# Patient Record
Sex: Male | Born: 1955 | Race: White | Hispanic: No | Marital: Married | Smoking: Former smoker
Health system: Southern US, Community
[De-identification: ages and names within clinical notes are randomized; demographics above are authoritative.]

## PROBLEM LIST (undated history)

## (undated) DIAGNOSIS — L739 Follicular disorder, unspecified: Secondary | ICD-10-CM

## (undated) DIAGNOSIS — E7800 Pure hypercholesterolemia, unspecified: Secondary | ICD-10-CM

## (undated) DIAGNOSIS — I2510 Atherosclerotic heart disease of native coronary artery without angina pectoris: Secondary | ICD-10-CM

## (undated) DIAGNOSIS — I10 Essential (primary) hypertension: Secondary | ICD-10-CM

## (undated) HISTORY — PX: ROTATOR CUFF REPAIR: SHX139

## (undated) HISTORY — DX: Atherosclerotic heart disease of native coronary artery without angina pectoris: I25.10

## (undated) HISTORY — PX: TONSILLECTOMY AND ADENOIDECTOMY: SUR1326

---

## 2000-11-23 ENCOUNTER — Ambulatory Visit (HOSPITAL_COMMUNITY): Admission: RE | Admit: 2000-11-23 | Discharge: 2000-11-23 | Payer: Self-pay | Admitting: Gastroenterology

## 2003-11-21 ENCOUNTER — Ambulatory Visit (HOSPITAL_COMMUNITY): Admission: RE | Admit: 2003-11-21 | Discharge: 2003-11-21 | Payer: Self-pay | Admitting: Gastroenterology

## 2012-03-01 ENCOUNTER — Other Ambulatory Visit: Payer: Self-pay

## 2012-03-01 ENCOUNTER — Encounter (HOSPITAL_BASED_OUTPATIENT_CLINIC_OR_DEPARTMENT_OTHER): Payer: Self-pay | Admitting: Family Medicine

## 2012-03-01 ENCOUNTER — Inpatient Hospital Stay (HOSPITAL_BASED_OUTPATIENT_CLINIC_OR_DEPARTMENT_OTHER)
Admission: EM | Admit: 2012-03-01 | Discharge: 2012-03-06 | DRG: 246 | Disposition: A | Discharge status: Home / Self Care | Payer: PRIVATE HEALTH INSURANCE | Attending: Internal Medicine | Admitting: Internal Medicine

## 2012-03-01 ENCOUNTER — Emergency Department (HOSPITAL_BASED_OUTPATIENT_CLINIC_OR_DEPARTMENT_OTHER): Payer: PRIVATE HEALTH INSURANCE

## 2012-03-01 DIAGNOSIS — K5289 Other specified noninfective gastroenteritis and colitis: Secondary | ICD-10-CM | POA: Diagnosis present

## 2012-03-01 DIAGNOSIS — Z87891 Personal history of nicotine dependence: Secondary | ICD-10-CM

## 2012-03-01 DIAGNOSIS — Z79899 Other long term (current) drug therapy: Secondary | ICD-10-CM

## 2012-03-01 DIAGNOSIS — I2510 Atherosclerotic heart disease of native coronary artery without angina pectoris: Secondary | ICD-10-CM | POA: Diagnosis present

## 2012-03-01 DIAGNOSIS — R112 Nausea with vomiting, unspecified: Secondary | ICD-10-CM | POA: Diagnosis present

## 2012-03-01 DIAGNOSIS — Z7982 Long term (current) use of aspirin: Secondary | ICD-10-CM

## 2012-03-01 DIAGNOSIS — E872 Acidosis, unspecified: Secondary | ICD-10-CM | POA: Diagnosis present

## 2012-03-01 DIAGNOSIS — E875 Hyperkalemia: Secondary | ICD-10-CM | POA: Diagnosis present

## 2012-03-01 DIAGNOSIS — E1110 Type 2 diabetes mellitus with ketoacidosis without coma: Secondary | ICD-10-CM | POA: Diagnosis present

## 2012-03-01 DIAGNOSIS — D72829 Elevated white blood cell count, unspecified: Secondary | ICD-10-CM | POA: Diagnosis present

## 2012-03-01 DIAGNOSIS — I959 Hypotension, unspecified: Secondary | ICD-10-CM | POA: Diagnosis present

## 2012-03-01 DIAGNOSIS — I214 Non-ST elevation (NSTEMI) myocardial infarction: Secondary | ICD-10-CM

## 2012-03-01 DIAGNOSIS — R197 Diarrhea, unspecified: Secondary | ICD-10-CM | POA: Diagnosis present

## 2012-03-01 DIAGNOSIS — K859 Acute pancreatitis without necrosis or infection, unspecified: Secondary | ICD-10-CM

## 2012-03-01 DIAGNOSIS — E7800 Pure hypercholesterolemia, unspecified: Secondary | ICD-10-CM | POA: Diagnosis present

## 2012-03-01 DIAGNOSIS — I309 Acute pericarditis, unspecified: Secondary | ICD-10-CM

## 2012-03-01 DIAGNOSIS — E1010 Type 1 diabetes mellitus with ketoacidosis without coma: Secondary | ICD-10-CM | POA: Diagnosis present

## 2012-03-01 DIAGNOSIS — E785 Hyperlipidemia, unspecified: Secondary | ICD-10-CM | POA: Diagnosis present

## 2012-03-01 DIAGNOSIS — L089 Local infection of the skin and subcutaneous tissue, unspecified: Secondary | ICD-10-CM | POA: Diagnosis present

## 2012-03-01 DIAGNOSIS — D649 Anemia, unspecified: Secondary | ICD-10-CM | POA: Diagnosis present

## 2012-03-01 DIAGNOSIS — Z794 Long term (current) use of insulin: Secondary | ICD-10-CM

## 2012-03-01 DIAGNOSIS — I252 Old myocardial infarction: Secondary | ICD-10-CM | POA: Diagnosis present

## 2012-03-01 DIAGNOSIS — Z955 Presence of coronary angioplasty implant and graft: Secondary | ICD-10-CM

## 2012-03-01 DIAGNOSIS — E871 Hypo-osmolality and hyponatremia: Secondary | ICD-10-CM | POA: Diagnosis present

## 2012-03-01 HISTORY — DX: Pure hypercholesterolemia, unspecified: E78.00

## 2012-03-01 LAB — POCT I-STAT 3, ART BLOOD GAS (G3+)
Acid-base deficit: 25 mmol/L — ABNORMAL HIGH (ref 0.0–2.0)
Bicarbonate: 3.5 mEq/L — ABNORMAL LOW (ref 20.0–24.0)
pO2, Arterial: 128 mmHg — ABNORMAL HIGH (ref 80.0–100.0)

## 2012-03-01 LAB — CBC WITH DIFFERENTIAL/PLATELET
Basophils Relative: 0 % (ref 0–1)
Eosinophils Absolute: 0 10*3/uL (ref 0.0–0.7)
Eosinophils Relative: 0 % (ref 0–5)
Hemoglobin: 17.7 g/dL — ABNORMAL HIGH (ref 13.0–17.0)
Lymphocytes Relative: 7 % — ABNORMAL LOW (ref 12–46)
Monocytes Relative: 9 % (ref 3–12)
Neutrophils Relative %: 84 % — ABNORMAL HIGH (ref 43–77)
RBC: 5.51 MIL/uL (ref 4.22–5.81)
WBC: 20.6 10*3/uL — ABNORMAL HIGH (ref 4.0–10.5)

## 2012-03-01 LAB — BASIC METABOLIC PANEL
BUN: 17 mg/dL (ref 6–23)
CO2: 11 mEq/L — ABNORMAL LOW (ref 19–32)
CO2: 13 mEq/L — ABNORMAL LOW (ref 19–32)
Calcium: 7.6 mg/dL — ABNORMAL LOW (ref 8.4–10.5)
Chloride: 107 mEq/L (ref 96–112)
Creatinine, Ser: 0.8 mg/dL (ref 0.50–1.35)
Glucose, Bld: 195 mg/dL — ABNORMAL HIGH (ref 70–99)
Sodium: 136 mEq/L (ref 135–145)

## 2012-03-01 LAB — COMPREHENSIVE METABOLIC PANEL
ALT: 18 U/L (ref 0–53)
Alkaline Phosphatase: 112 U/L (ref 39–117)
CO2: 5 mEq/L — CL (ref 19–32)
Chloride: 91 mEq/L — ABNORMAL LOW (ref 96–112)
GFR calc Af Amer: 75 mL/min — ABNORMAL LOW (ref >90)
Glucose, Bld: 395 mg/dL — ABNORMAL HIGH (ref 70–99)
Potassium: 5.6 mEq/L — ABNORMAL HIGH (ref 3.5–5.1)
Sodium: 132 mEq/L — ABNORMAL LOW (ref 135–145)
Total Protein: 8.7 g/dL — ABNORMAL HIGH (ref 6.0–8.3)

## 2012-03-01 LAB — CBC
HCT: 39.3 % (ref 39.0–52.0)
Hemoglobin: 13.8 g/dL (ref 13.0–17.0)
MCH: 31.9 pg (ref 26.0–34.0)
MCHC: 35.1 g/dL (ref 30.0–36.0)
MCV: 91 fL (ref 78.0–100.0)

## 2012-03-01 LAB — GLUCOSE, CAPILLARY
Glucose-Capillary: 389 mg/dL — ABNORMAL HIGH (ref 70–99)
Glucose-Capillary: 370 mg/dL — ABNORMAL HIGH (ref 70–99)
Glucose-Capillary: 235 mg/dL — ABNORMAL HIGH (ref 70–99)
Glucose-Capillary: 233 mg/dL — ABNORMAL HIGH (ref 70–99)

## 2012-03-01 LAB — MRSA PCR SCREENING: MRSA by PCR: NEGATIVE

## 2012-03-01 MED ORDER — SODIUM CHLORIDE 0.9 % IV SOLN
INTRAVENOUS | Status: DC
  Administered 2012-03-01: 3.1 [IU]/h via INTRAVENOUS

## 2012-03-01 MED ORDER — ONDANSETRON HCL 4 MG/2ML IJ SOLN
4.0000 mg | Freq: Once | INTRAMUSCULAR | Status: AC
  Administered 2012-03-01: 4 mg via INTRAVENOUS
  Filled 2012-03-01: qty 2

## 2012-03-01 MED ORDER — SODIUM CHLORIDE 0.9 % IV SOLN: INTRAVENOUS | Status: DC

## 2012-03-01 MED ORDER — SODIUM CHLORIDE 0.9 % IV SOLN
INTRAVENOUS | Status: AC
  Administered 2012-03-01: 19:00:00 via INTRAVENOUS

## 2012-03-01 MED ORDER — SODIUM CHLORIDE 0.9 % IV SOLN
INTRAVENOUS | Status: DC
  Administered 2012-03-02: 1.6 [IU]/h via INTRAVENOUS
  Filled 2012-03-01: qty 1

## 2012-03-01 MED ORDER — ASPIRIN EC 325 MG PO TBEC
325.0000 mg | DELAYED_RELEASE_TABLET | Freq: Once | ORAL | Status: AC
  Administered 2012-03-01: 325 mg via ORAL
  Filled 2012-03-01: qty 1

## 2012-03-01 MED ORDER — HEPARIN SODIUM (PORCINE) 5000 UNIT/ML IJ SOLN
5000.0000 [IU] | Freq: Three times a day (TID) | INTRAMUSCULAR | Status: DC
  Administered 2012-03-01: 5000 [IU] via SUBCUTANEOUS
  Filled 2012-03-01 (×2): qty 1

## 2012-03-01 MED ORDER — INSULIN REGULAR HUMAN 100 UNIT/ML IJ SOLN
INTRAMUSCULAR | Status: AC
  Filled 2012-03-01: qty 1

## 2012-03-01 MED ORDER — SODIUM CHLORIDE 0.9 % IV SOLN
INTRAVENOUS | Status: DC
  Administered 2012-03-01: 19:00:00 via INTRAVENOUS
  Administered 2012-03-02: 150 mL/h via INTRAVENOUS
  Administered 2012-03-02 – 2012-03-03 (×4): via INTRAVENOUS

## 2012-03-01 MED ORDER — MORPHINE SULFATE 2 MG/ML IJ SOLN
1.0000 mg | INTRAMUSCULAR | Status: DC | PRN
  Administered 2012-03-01: 1 mg via INTRAVENOUS
  Administered 2012-03-02: 2 mg via INTRAVENOUS
  Administered 2012-03-02: 1 mg via INTRAVENOUS
  Administered 2012-03-03 (×3): 2 mg via INTRAVENOUS
  Filled 2012-03-01 (×6): qty 1

## 2012-03-01 MED ORDER — ASPIRIN 81 MG PO CHEW
CHEWABLE_TABLET | ORAL | Status: AC
  Filled 2012-03-01: qty 4

## 2012-03-01 MED ORDER — DEXTROSE 50 % IV SOLN: 25.0000 mL | INTRAVENOUS | Status: DC | PRN

## 2012-03-01 MED ORDER — MORPHINE SULFATE 2 MG/ML IJ SOLN
INTRAMUSCULAR | Status: AC
  Administered 2012-03-01: 1 mg via INTRAVENOUS
  Filled 2012-03-01: qty 1

## 2012-03-01 MED ORDER — MORPHINE SULFATE 4 MG/ML IJ SOLN
4.0000 mg | Freq: Once | INTRAMUSCULAR | Status: AC
  Administered 2012-03-01: 4 mg via INTRAVENOUS
  Filled 2012-03-01: qty 1

## 2012-03-01 MED ORDER — DEXTROSE-NACL 5-0.45 % IV SOLN
INTRAVENOUS | Status: DC
  Administered 2012-03-01 – 2012-03-02 (×3): via INTRAVENOUS

## 2012-03-01 MED ORDER — SODIUM CHLORIDE 0.9 % IV BOLUS (SEPSIS)
1000.0000 mL | Freq: Once | INTRAVENOUS | Status: AC
  Administered 2012-03-01: 1000 mL via INTRAVENOUS

## 2012-03-01 MED ORDER — ONDANSETRON HCL 4 MG/2ML IJ SOLN: 4.0000 mg | Freq: Three times a day (TID) | INTRAMUSCULAR | Status: DC | PRN

## 2012-03-01 MED ORDER — MORPHINE SULFATE 4 MG/ML IJ SOLN
INTRAMUSCULAR | Status: AC
  Administered 2012-03-01: 4 mg
  Filled 2012-03-01: qty 1

## 2012-03-01 NOTE — H&P (Signed)
Name: Jordan Waller MRN: [id number] DOB: 12-Apr-1956    LOS: 0  Referring Provider:  ED / [facility] Reason for Referral:  DKA   PULMONARY / CRITICAL CARE MEDICINE  HPI:  56 y/o M with hx of DM on insulin pump, HLD presented [facility]/12 with a 3-4 day hx of vomiting, diarrhea, generalized pain, HA and elevated blood sugars.  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  Also patient reports significant drinking at conference.  Work up demonstrated ph 7.0 / pCO2 13 / HCO3 3.5, K 5.6, creatinine 1.20, lipase 810, lactate 1.8 and moderate acetone in blood.  Rx's with NS, IV insulin and Tx to [facility] for further care.    Past Medical History  Diagnosis Date  . Diabetes mellitus   . High cholesterol    Past Surgical History  Procedure Date  . Rotator cuff repair    Prior to Admission medications   Medication Sig Start Date End Date Taking? Authorizing Provider  ibuprofen (ADVIL,MOTRIN) 200 MG tablet Take 600 mg by mouth every 8 (eight) hours as needed. For poby pain   Yes Historical Provider, MD  Insulin Human (INSULIN PUMP) 100 unit/ml SOLN Inject into the skin. Patient uses Novolog in pump on sliding scale up to 60 units daily as directed   Yes Historical Provider, MD  rosuvastatin (CRESTOR) 10 MG tablet Take 10 mg by mouth daily.   Yes Historical Provider, MD   Allergies No Known Allergies  Family History History reviewed. No pertinent family history. Social History  reports that he has never smoked. He does not have any smokeless tobacco history on file. He reports that he drinks alcohol. He reports that he does not use illicit drugs.  Review Of Systems:   Gen: Denies fever, chills, weight change, fatigue, night sweats HEENT: Denies blurred vision, double vision, hearing loss, tinnitus, sinus congestion, rhinorrhea, sore throat, neck stiffness, dysphagia PULM: Denies shortness of breath, cough,  sputum production, hemoptysis, wheezing CV: Denies chest pain, edema, orthopnea, paroxysmal nocturnal dyspnea, palpitations GI: Denies abdominal pain, hematochezia, melena, constipation, change in bowel habits.   Indicates n/v/d. GU: Denies dysuria, hematuria, polyuria, oliguria, urethral discharge Endocrine: Denies hot or cold intolerance, polyuria, polyphagia.  Decreased appetite.  Derm: Denies rash, dry skin, scaling or peeling skin change Heme: Denies easy bruising, bleeding, bleeding gums Neuro: Denies headache, numbness, weakness, slurred speech, loss of memory or consciousness  Brief patient description:  56 y/o M with DKA in setting of nausea / vomiting / diarrhea.  Events Since Admission: 8/12 - Admit with DKA  Current Status:  Vital Signs: Temp:  [97.5 F (36.4 C)-98.3 F (36.8 C)] 98.3 F (36.8 C) (08/12 1218) Pulse Rate:  [86-107] 88  (08/12 1800) Resp:  [12-24] 12  (08/12 1800) BP: (101-149)/(63-84) 101/63 mmHg (08/12 1800) SpO2:  [97 %-100 %] 98 % (08/12 1800) Weight:  [68.7 kg (151 lb 7.3 oz)-71.668 kg (158 lb)] 68.7 kg (151 lb 7.3 oz) (08/12 1733)  Physical Examination: General:  wdwn adult male in NAD Neuro:  AAOx4, speech clear, MAE HEENT:  Mm pink/moist, no jvd Cardiovascular:  s1s2 rrr, no m/r/g Lungs:  resp's even/non-labored, lungs bilaterally clear Abdomen:  Round / soft, bsx4 active Musculoskeletal:  No acute deformities Skin:  Red face, no breakdown  Active Problems:  DKA (diabetic ketoacidoses)  Metabolic acidosis  Nausea & vomiting  Diarrhea  ASSESSMENT AND PLAN  PULMONARY  Lab  03/01/12 1234  PHART 7.021*  PCO2ART 13.4*  PO2ART 128.0*  HCO3 3.5*  O2SAT 97.0   CXR:  8/12>>>nad  A:   No acute issues  P:   Goal SpO2>92 Supplemental oxygen  CARDIOVASCULAR  Lab 03/01/12 1045  TROPONINI --  LATICACIDVEN 1.8  PROBNP --   ECG:  Pending>>> Lines:   A:  Rule out MI in setting of DM (presented with nausea / vomiting, may be  atypical presentation of ACS given DM) .  Hemodynamically stable.  P:  check EKG cycle cardiac enzymes  RENAL  Lab 03/01/12 1010  NA 132*  K 5.6*  CL 91*  CO2 5*  BUN 23  CREATININE 1.20  CALCIUM 9.7  MG --  PHOS --   Intake/Output      08/12 0701 - 08/13 0700   I.V. (mL/kg) 5036.2 (73.3)   Total Intake(mL/kg) 5036.2 (73.3)   Urine (mL/kg/hr) 800 (0.8)   Total Output 800   Net +4236.2        Foley:  NA  A: Hyponatremia.  Hyperkalemia.  Metabolic Acidosis  P:   correct underlying causes fluids per DKA protocol Trend BMP  GASTROINTESTINAL  Lab 03/01/12 1010  AST 14  ALT 18  ALKPHOS 112  BILITOT 0.5  PROT 8.7*  ALBUMIN 5.5*   Lipase 810  A: Nausea / Vomiting / Diarrhea.  History of recent ETOH intake / elevated lipase - rule out pancreatitis - however clinically benign abdomen.  P:   PRN zofran follow lipase  HEMATOLOGIC  Lab 03/01/12 2015 03/01/12 1010  HGB 13.8 17.7*  HCT 39.3 49.8  PLT 189 309  INR -- --  APTT -- --   A:   No acute issues  P:  Trend CBC  INFECTIOUS  Lab 03/01/12 2015 03/01/12 1010  WBC 17.3* 20.6*  PROCALCITON -- --   Cultures: 8/12 UA>>> 8/12 BCx2>>> 8/12 PCT>>>  Antibiotics: Defered  A:  Leukocytosis, likely reactive.  No clear source of infection.  P:   check pct follow cultures hold abx for now  ENDOCRINE  Lab 03/01/12 1911 03/01/12 1807 03/01/12 1725 03/01/12 1628 03/01/12 1524  GLUCAP 168* 176* 233* 235* 274*   A:  DKA.  DM on insulin pump. P:   Per DKA protocol Diabetic educator consult in AM  NEUROLOGIC  A:  No acute Issues P:   monitor  BEST PRACTICE / DISPOSITION Level of Care:  ICU Primary Service:  PCCM Consultants:  None Code Status:  Full Code Diet:  NPO DVT Px:  Heparin GI Px:  None indicated Skin Integrity:  Intact Social / Family:  Wife updated at bedside  Canary Brim, NP-C [facility] Pgr: [phone] or [phone]  03/01/2012, 8:49  PM  Patient examined.  Records reviewed.  Case discussed with NP.  Assessment and plan as above.  The patient is critically ill with multiple organ systems failure and requires high complexity decision making for assessment and support, frequent evaluation and titration of therapies, application of advanced monitoring technologies and extensive interpretation of multiple databases. Critical Care Time devoted to patient care services described in this note is 35 minutes.  Orlean Bradford, M.D., F.C.C.P. Pulmonary and Critical Care Medicine [facility] Cell: [phone] Pager: [phone]

## 2012-03-01 NOTE — Progress Notes (Signed)
eLink Physician-Brief Progress Note Patient Name: GENE GLAZEBROOK DOB: 01-Oct-1955 MRN: [id number]  Date of Service  03/01/2012   HPI/Events of Note     eICU Interventions  ASA for pos trops   Intervention Category Intermediate Interventions: Diagnostic test evaluation  ALVA,RAKESH V. 03/01/2012, 9:30 PM

## 2012-03-01 NOTE — ED Notes (Signed)
Insulin drip increased from 3.1 units per hour to 6.4 units per hour per glucose stabilizer

## 2012-03-01 NOTE — ED Notes (Signed)
Pts wife removed his insulin pump per dr hunt request prior to the insulin drip started site reddened wife concerned area of redness marked states she doesn't know when he last changed the site as he was at a convention all weekend pt groggy unable to accurately tell us when he last changed the site. RT at bedside for abg unable to obtain dr hunt notified

## 2012-03-01 NOTE — ED Notes (Signed)
Report called to theresa  In icu [place]

## 2012-03-01 NOTE — ED Notes (Signed)
Pt c/o vomiting and generalized pain with headache since 3am yesterday. Pt has h/o DM with insulin pump and elevated blood sugar. Pt took advil at 0730am and 1 po phenergan. Pt reports vomiting approx 1 hr after taking med.

## 2012-03-01 NOTE — ED Provider Notes (Signed)
History     CSN: [id number]  Arrival date & time 03/01/12  1010   First MD Initiated Contact with Patient 03/01/12 1027      Chief Complaint  Patient presents with  . Emesis    (Consider location/radiation/quality/duration/timing/severity/associated sxs/prior treatment) Patient is a 56 y.o. male presenting with vomiting.  Emesis  Associated symptoms include headaches. Pertinent negatives include no abdominal pain.   Patient is a 56 year old male who presents today to planning of 3-4 days of increasing nausea, vomiting, and malaise. Patient has history of type 1 diabetes diagnosed at age 21 and normally maintained on an insulin pump. Patient has had frequent hyperglycemia with values as high as 600 over the past several days. He was attending a conference with up to 20,000 people from all over the world and per his wife continued to push himself though he was not feeling well. He has been around some international visitors who have had some similar symptoms. Patient had 2-3 episodes of diarrhea but is no longer having this. He denies any urinary symptoms or cough. Patient has not had any fevers. He does feel like his glucose has been responding initially 2 boluses of insulin and his wife feels like there is no problem with pump dysfunction at this site. Patient denies chest pain, shortness of breath, or other concerning symptoms. Patient does complain of a severe aching bifrontal headache but has no meningismus and no history of headaches. Patient  has no numbness, tingling, paresthesias, visual changes, or weakness.  Patient does have some associated photophobia. His room smells of ketones. There are no other associated or modifying factors. Past Medical History  Diagnosis Date  . Diabetes mellitus   . High cholesterol     Past Surgical History  Procedure Date  . Rotator cuff repair     History reviewed. No pertinent family history.  History  Substance Use Topics  . Smoking status:  Never Smoker   . Smokeless tobacco: Not on file  . Alcohol Use: Yes      Review of Systems  Constitutional: Positive for activity change, appetite change and fatigue.  Eyes: Positive for photophobia.  Respiratory: Negative.   Cardiovascular: Negative.   Gastrointestinal: Positive for nausea and vomiting. Negative for abdominal pain.  Genitourinary: Negative.   Musculoskeletal: Negative.   Skin: Negative.   Neurological: Positive for headaches.  Hematological: Negative.   Psychiatric/Behavioral: Negative.   All other systems reviewed and are negative.    Allergies  Review of patient's allergies indicates no known allergies.  Home Medications   Current Outpatient Rx  Name Route Sig Dispense Refill  . IBUPROFEN 200 MG PO TABS Oral Take 600 mg by mouth every 8 (eight) hours as needed. For poby pain    . INSULIN PUMP Subcutaneous Inject into the skin. Patient uses Novolog in pump on sliding scale up to 60 units daily as directed    . ROSUVASTATIN CALCIUM 10 MG PO TABS Oral Take 10 mg by mouth daily.      BP 138/76  Pulse 96  Temp 98.3 F (36.8 C) (Oral)  Resp 24  Ht 5\' 7"  (1.702 m)  Wt 158 lb (71.668 kg)  BMI 24.75 kg/m2  SpO2 99%  Physical Exam  Nursing note and vitals reviewed. GEN: Well-developed, well-nourished male in no distress HEENT: Atraumatic, normocephalic. Oropharynx clear without erythema EYES: PERRLA BL, no scleral icterus. NECK: Trachea midline, no meningismus CV: regular rate and rhythm. No murmurs, rubs, or gallops PULM: No respiratory distress.  No crackles, wheezes, or rales. GI: soft, non-tender. No guarding, rebound, or tenderness. + bowel sounds  GU: deferred Neuro: cranial nerves grossly 2-12 intact, no abnormalities of strength or sensation, A and O x 3 MSK: Patient moves all 4 extremities symmetrically, no deformity, edema, or injury noted Skin: No rashes petechiae, purpura, or jaundice Psych: no abnormality of mood   ED Course    Procedures (including critical care time)  CRITICAL CARE Performed by: Cyndra Numbers   Total critical care time: 45 min  Critical care time was exclusive of separately billable procedures and treating other patients.  Critical care was necessary to treat or prevent imminent or life-threatening deterioration.  Critical care was time spent personally by me on the following activities: development of treatment plan with patient and/or surrogate as well as nursing, discussions with consultants, evaluation of patient's response to treatment, examination of patient, obtaining history from patient or surrogate, ordering and performing treatments and interventions, ordering and review of laboratory studies, ordering and review of radiographic studies, pulse oximetry and re-evaluation of patient's condition.  Labs Reviewed  GLUCOSE, CAPILLARY - Abnormal; Notable for the following:    Glucose-Capillary 389 (*)     All other components within normal limits  CBC WITH DIFFERENTIAL - Abnormal; Notable for the following:    WBC 20.6 (*)     Hemoglobin 17.7 (*)     Neutrophils Relative 84 (*)     Lymphocytes Relative 7 (*)     Neutro Abs 17.3 (*)     Monocytes Absolute 1.9 (*)     All other components within normal limits  COMPREHENSIVE METABOLIC PANEL - Abnormal; Notable for the following:    Sodium 132 (*)     Potassium 5.6 (*)     Chloride 91 (*)     CO2 5 (*)     Glucose, Bld 395 (*)     Total Protein 8.7 (*)     Albumin 5.5 (*)     GFR calc non Af Amer 65 (*)     GFR calc Af Amer 75 (*)     All other components within normal limits  LIPASE, BLOOD - Abnormal; Notable for the following:    Lipase 810 (*)     All other components within normal limits  KETONES, QUALITATIVE - Abnormal; Notable for the following:    Acetone, Bld MODERATE (*)     All other components within normal limits  GLUCOSE, CAPILLARY - Abnormal; Notable for the following:    Glucose-Capillary 370 (*)     All other  components within normal limits  POCT I-STAT 3, BLOOD GAS (G3+) - Abnormal; Notable for the following:    pH, Arterial 7.021 (*)     pCO2 arterial 13.4 (*)     pO2, Arterial 128.0 (*)     Bicarbonate 3.5 (*)     Acid-base deficit 25.0 (*)     All other components within normal limits  GLUCOSE, CAPILLARY - Abnormal; Notable for the following:    Glucose-Capillary 382 (*)     All other components within normal limits  LACTIC ACID, PLASMA  URINALYSIS, ROUTINE W REFLEX MICROSCOPIC   Dg Chest 2 View  03/01/2012  *RADIOLOGY REPORT*  Clinical Data: Vomiting, dehydration, history of diabetes  CHEST - 2 VIEW  Comparison: None.  Findings: Normal cardiac silhouette and mediastinal contours.  No focal parenchymal masses.  No pleural effusion or pneumothorax.  No acute osseous abnormalities.  IMPRESSION: No acute cardiopulmonary disease.  Original Report  Authenticated By: Waynard Reeds, M.D.     1. DKA, type 1       MDM  Patient was related by myself. Based on evaluation patient had workup for his hyperglycemia. Patient had elevated white count consistent with several days of vomiting. Chest x-ray was unremarkable and patient remained afebrile with no elevation of his lactate. Following IV fluids and bolus of 7.2 units per insulin given upon arrival through the patient's insulin pump patient had no improvement in his glucose. Given positive ketones in the blood patient had insulin drip started while remainder of lab report is still pending. Complete metabolic panel showed a gap of 35. Patient had evidence of dehydration but no acute renal insufficiency. Patient was slightly hyperkalemic but is currently on insulin drip. Patient liver function was within normal limits however lipase was elevated to 810. I discussed the patient with his endocrinologist who did confirm that the patient is a type I diabetic diagnosed at age 65. 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 In further discussion with the patient he did drink a fair amount of alcohol at the conference he was attending this week. I suspect that this is the cause of his elevated lipase. He did not have any abdominal pain throughout his evaluation here. Patient is no longer vomiting. I discussed the patient with his endocrinologist Dr. Sharl Ma. I also discussed the patient with Dr.Zubelevitskiy critical care who accepted the patient for admission. Patient had an arterial blood gas performed by myself which did show significant acidosis. Dr. Herma Carson. did not fill the bicarbonate drip was necessary at this time and this was not initiated. Patient will be transferred to [facility] ICU for further management.        Cyndra Numbers, MD 03/01/12 1354

## 2012-03-01 NOTE — ED Notes (Signed)
Earring white metal left in room in a green denture container. Taken to Engineer, materials to be locked up.

## 2012-03-01 NOTE — ED Notes (Signed)
cbg repeated insulin drip increased to 8.6 units per glucose stabilizer

## 2012-03-01 NOTE — ED Notes (Signed)
RT attempted ABG twice with no success. Dr. Alto Denver notified and she will attempt herself.

## 2012-03-01 NOTE — Progress Notes (Signed)
Critical troponin called to Dr alva> troponin 1.58

## 2012-03-02 ENCOUNTER — Encounter (HOSPITAL_COMMUNITY): Payer: Self-pay | Admitting: Physician Assistant

## 2012-03-02 DIAGNOSIS — K859 Acute pancreatitis without necrosis or infection, unspecified: Secondary | ICD-10-CM

## 2012-03-02 DIAGNOSIS — I309 Acute pericarditis, unspecified: Secondary | ICD-10-CM

## 2012-03-02 DIAGNOSIS — I252 Old myocardial infarction: Secondary | ICD-10-CM | POA: Diagnosis present

## 2012-03-02 DIAGNOSIS — I517 Cardiomegaly: Secondary | ICD-10-CM

## 2012-03-02 DIAGNOSIS — I214 Non-ST elevation (NSTEMI) myocardial infarction: Principal | ICD-10-CM

## 2012-03-02 DIAGNOSIS — E1110 Type 2 diabetes mellitus with ketoacidosis without coma: Secondary | ICD-10-CM

## 2012-03-02 LAB — BASIC METABOLIC PANEL
BUN: 15 mg/dL (ref 6–23)
BUN: 9 mg/dL (ref 6–23)
CO2: 14 mEq/L — ABNORMAL LOW (ref 19–32)
CO2: 15 mEq/L — ABNORMAL LOW (ref 19–32)
CO2: 17 mEq/L — ABNORMAL LOW (ref 19–32)
CO2: 19 mEq/L (ref 19–32)
CO2: 21 mEq/L (ref 19–32)
Calcium: 7.6 mg/dL — ABNORMAL LOW (ref 8.4–10.5)
Calcium: 7.8 mg/dL — ABNORMAL LOW (ref 8.4–10.5)
Chloride: 110 mEq/L (ref 96–112)
Chloride: 108 mEq/L (ref 96–112)
Chloride: 106 mEq/L (ref 96–112)
Creatinine, Ser: 0.75 mg/dL (ref 0.50–1.35)
Creatinine, Ser: 0.71 mg/dL (ref 0.50–1.35)
GFR calc Af Amer: >90 mL/min (ref >90)
GFR calc Af Amer: >90 mL/min (ref >90)
GFR calc non Af Amer: >90 mL/min (ref >90)
GFR calc non Af Amer: >90 mL/min (ref >90)
Glucose, Bld: 149 mg/dL — ABNORMAL HIGH (ref 70–99)
Glucose, Bld: 171 mg/dL — ABNORMAL HIGH (ref 70–99)
Glucose, Bld: 183 mg/dL — ABNORMAL HIGH (ref 70–99)
Potassium: 3.1 mEq/L — ABNORMAL LOW (ref 3.5–5.1)
Potassium: 3.9 mEq/L (ref 3.5–5.1)
Sodium: 135 mEq/L (ref 135–145)
Sodium: 136 mEq/L (ref 135–145)

## 2012-03-02 LAB — URINALYSIS, ROUTINE W REFLEX MICROSCOPIC
Bilirubin Urine: NEGATIVE
Glucose, UA: >1000 mg/dL — AB
Ketones, ur: 40 mg/dL — AB
Protein, ur: 30 mg/dL — AB
pH: 6 (ref 5.0–8.0)

## 2012-03-02 LAB — HEPARIN LEVEL (UNFRACTIONATED)
Heparin Unfractionated: 0.1 IU/mL — ABNORMAL LOW (ref 0.30–0.70)
Heparin Unfractionated: <0.1 IU/mL — ABNORMAL LOW (ref 0.30–0.70)

## 2012-03-02 LAB — GLUCOSE, CAPILLARY
Glucose-Capillary: 171 mg/dL — ABNORMAL HIGH (ref 70–99)
Glucose-Capillary: 174 mg/dL — ABNORMAL HIGH (ref 70–99)
Glucose-Capillary: 174 mg/dL — ABNORMAL HIGH (ref 70–99)
Glucose-Capillary: 150 mg/dL — ABNORMAL HIGH (ref 70–99)
Glucose-Capillary: 145 mg/dL — ABNORMAL HIGH (ref 70–99)
Glucose-Capillary: 148 mg/dL — ABNORMAL HIGH (ref 70–99)
Glucose-Capillary: 142 mg/dL — ABNORMAL HIGH (ref 70–99)
Glucose-Capillary: 151 mg/dL — ABNORMAL HIGH (ref 70–99)

## 2012-03-02 LAB — TROPONIN I: Troponin I: 4.76 ng/mL (ref <0.30)

## 2012-03-02 LAB — LIPASE, BLOOD: Lipase: 223 U/L — ABNORMAL HIGH (ref 11–59)

## 2012-03-02 LAB — PHOSPHORUS: Phosphorus: 0.6 mg/dL — CL (ref 2.3–4.6)

## 2012-03-02 LAB — MAGNESIUM: Magnesium: 1.9 mg/dL (ref 1.5–2.5)

## 2012-03-02 LAB — CARDIAC PANEL(CRET KIN+CKTOT+MB+TROPI)
CK, MB: 9.4 ng/mL (ref 0.3–4.0)
Troponin I: 3.66 ng/mL (ref <0.30)

## 2012-03-02 LAB — URINE MICROSCOPIC-ADD ON

## 2012-03-02 MED ORDER — DILTIAZEM HCL 100 MG IV SOLR: 5.0000 mg/h | INTRAVENOUS | Status: DC

## 2012-03-02 MED ORDER — CHLORHEXIDINE GLUCONATE 0.12 % MT SOLN
15.0000 mL | Freq: Two times a day (BID) | OROMUCOSAL | Status: DC
  Administered 2012-03-03 – 2012-03-05 (×4): 15 mL via OROMUCOSAL
  Filled 2012-03-02 (×7): qty 15

## 2012-03-02 MED ORDER — POTASSIUM CHLORIDE 10 MEQ/100ML IV SOLN
10.0000 meq | INTRAVENOUS | Status: AC
  Administered 2012-03-02 (×4): 10 meq via INTRAVENOUS
  Filled 2012-03-02 (×4): qty 100

## 2012-03-02 MED ORDER — BIOTENE DRY MOUTH MT LIQD
15.0000 mL | Freq: Two times a day (BID) | OROMUCOSAL | Status: DC
  Administered 2012-03-02 – 2012-03-05 (×8): 15 mL via OROMUCOSAL

## 2012-03-02 MED ORDER — POTASSIUM CHLORIDE CRYS ER 20 MEQ PO TBCR: 40.0000 meq | EXTENDED_RELEASE_TABLET | Freq: Once | ORAL | Status: DC

## 2012-03-02 MED ORDER — HEPARIN (PORCINE) IN NACL 100-0.45 UNIT/ML-% IJ SOLN
1000.0000 [IU]/h | INTRAMUSCULAR | Status: DC
  Administered 2012-03-02: 850 [IU]/h via INTRAVENOUS
  Filled 2012-03-02 (×2): qty 250

## 2012-03-02 MED ORDER — ASPIRIN EC 81 MG PO TBEC
81.0000 mg | DELAYED_RELEASE_TABLET | Freq: Every day | ORAL | Status: DC
  Administered 2012-03-02 – 2012-03-04 (×3): 81 mg via ORAL
  Filled 2012-03-02 (×3): qty 1

## 2012-03-02 MED ORDER — COLCHICINE 0.6 MG PO TABS
0.6000 mg | ORAL_TABLET | Freq: Two times a day (BID) | ORAL | Status: DC
  Administered 2012-03-02 – 2012-03-05 (×7): 0.6 mg via ORAL
  Filled 2012-03-02 (×10): qty 1

## 2012-03-02 MED ORDER — POTASSIUM CHLORIDE 10 MEQ/100ML IV SOLN
10.0000 meq | INTRAVENOUS | Status: AC
  Administered 2012-03-02 (×4): 10 meq via INTRAVENOUS
  Filled 2012-03-02 (×3): qty 100

## 2012-03-02 MED ORDER — PANTOPRAZOLE SODIUM 40 MG PO TBEC
40.0000 mg | DELAYED_RELEASE_TABLET | Freq: Every day | ORAL | Status: DC
  Administered 2012-03-03 – 2012-03-05 (×3): 40 mg via ORAL
  Filled 2012-03-02 (×3): qty 1

## 2012-03-02 MED ORDER — BIOTENE DRY MOUTH MT LIQD: 15.0000 mL | Freq: Two times a day (BID) | OROMUCOSAL | Status: DC

## 2012-03-02 MED ORDER — POTASSIUM PHOSPHATE DIBASIC 3 MMOLE/ML IV SOLN
24.0000 mmol | Freq: Once | INTRAVENOUS | Status: AC
  Administered 2012-03-02: 24 mmol via INTRAVENOUS
  Filled 2012-03-02: qty 8

## 2012-03-02 MED ORDER — HEPARIN BOLUS VIA INFUSION
3000.0000 [IU] | Freq: Once | INTRAVENOUS | Status: AC
  Administered 2012-03-02: 3000 [IU] via INTRAVENOUS
  Filled 2012-03-02: qty 3000

## 2012-03-02 MED ORDER — NITROGLYCERIN 0.4 MG SL SUBL
0.4000 mg | SUBLINGUAL_TABLET | SUBLINGUAL | Status: DC | PRN
  Administered 2012-03-02 – 2012-03-03 (×3): 0.4 mg via SUBLINGUAL
  Filled 2012-03-02 (×2): qty 25

## 2012-03-02 MED ORDER — HEPARIN BOLUS VIA INFUSION
2000.0000 [IU] | Freq: Once | INTRAVENOUS | Status: AC
  Administered 2012-03-02: 2000 [IU] via INTRAVENOUS
  Filled 2012-03-02: qty 2000

## 2012-03-02 MED ORDER — MORPHINE SULFATE 2 MG/ML IJ SOLN
1.0000 mg | Freq: Once | INTRAMUSCULAR | Status: AC
  Administered 2012-03-02: 2 mg via INTRAVENOUS

## 2012-03-02 MED ORDER — CHLORHEXIDINE GLUCONATE 0.12 % MT SOLN: 15.0000 mL | Freq: Two times a day (BID) | OROMUCOSAL | Status: DC

## 2012-03-02 MED ORDER — POTASSIUM CHLORIDE CRYS ER 20 MEQ PO TBCR
40.0000 meq | EXTENDED_RELEASE_TABLET | Freq: Once | ORAL | Status: AC
  Administered 2012-03-02: 40 meq via ORAL
  Filled 2012-03-02: qty 2

## 2012-03-02 MED ORDER — NITROGLYCERIN 0.4 MG SL SUBL
SUBLINGUAL_TABLET | SUBLINGUAL | Status: AC
  Administered 2012-03-02: 0.4 mg via SUBLINGUAL
  Filled 2012-03-02: qty 25

## 2012-03-02 MED ORDER — HEPARIN (PORCINE) IN NACL 100-0.45 UNIT/ML-% IJ SOLN
1650.0000 [IU]/h | INTRAMUSCULAR | Status: DC
  Administered 2012-03-02: 1200 [IU]/h via INTRAVENOUS
  Administered 2012-03-03: 1500 [IU]/h via INTRAVENOUS
  Filled 2012-03-02 (×6): qty 250

## 2012-03-02 MED ORDER — PNEUMOCOCCAL VAC POLYVALENT 25 MCG/0.5ML IJ INJ
0.5000 mL | INJECTION | INTRAMUSCULAR | Status: AC
  Filled 2012-03-02: qty 0.5

## 2012-03-02 NOTE — Progress Notes (Signed)
  Echocardiogram 2D Echocardiogram has been performed.  Tell Rozelle 03/02/2012, 1:06 PM

## 2012-03-02 NOTE — Care Management Note (Signed)
    Page 1 of 1   03/02/2012     11:58:51 AM   CARE MANAGEMENT NOTE 03/02/2012  Patient:  Jordan Waller, Jordan Waller   Account Number:  [id number]  Date Initiated:  03/02/2012  Documentation initiated by:  Avie Arenas  Subjective/Objective Assessment:   DKA -  has spouse     Action/Plan:   Anticipated DC Date:  03/05/2012   Anticipated DC Plan:  HOME/SELF CARE      DC Planning Services  CM consult      Choice offered to / List presented to:             Status of service:  In process, will continue to follow Medicare Important Message given?   (If response is "NO", the following Medicare IM given date fields will be blank) Date Medicare IM given:   Date Additional Medicare IM given:    Discharge Disposition:    Per UR Regulation:  Reviewed for med. necessity/level of care/duration of stay  If discussed at Long Length of Stay Meetings, dates discussed:    Comments:  ContactNeldon, Shepard [phone]   [phone]

## 2012-03-02 NOTE — Progress Notes (Signed)
eLink Physician-Brief Progress Note Patient Name: Jordan Waller DOB: Jun 16, 1956 MRN: [id number]  Date of Service  03/02/2012   HPI/Events of Note   Rise in Trop to 4.2 from 1.5.  HD stable.  On ASA  eICU Interventions  Plan: Pharmacy to dose heparin for NSTEMI Will also order 2D ECHO Will need cardiology consult in AM      Yariana Hoaglund 03/02/2012, 3:08 AM

## 2012-03-02 NOTE — Progress Notes (Signed)
Patient admitted with DKA (CO2 5 on admit).  Patient reports nausea, vomiting, and diarrhea for 3-4 days prior to admission.  Has had diabetes for ~ 2 years.  Sees Dr. Sharl Ma (Endocrinologist) for DM management.  Last saw Dr. Sharl Ma about 6 weeks ago.  CO2 up to 17 this morning on BMET (7am).  Currently on D5 1/2 NS IVF plus IV insulin drip.    After receiving permission from patient, I called the 1-800 patient assistance line for this patient's insulin pump.  Reviewed the basal, bolus, and alarm history with a nurse on the pump assistance line.  After my conversation with the insulin pump RN, it was concluded that there should not be any problems with the insulin pump and that the cause of the patient's DKA was likely from an illness.    Spoke with patient and patient's wife about this information.  Patient's wife brought brand new insulin pump supplies for patient (new insulin, tubing, insertion site, reservoir, etc).    Once patient is clear from the acidosis and can take POs, patient can transition back to his insulin pump.  Will need to overlap the insulin pump and insulin drip by 1-2 hours to allow enough time for the insulin pump to get started.  After 1-2 hours, can d/c insulin drip and patient can manage CBGs solely with his insulin pump.  Will follow and assist as needed. Ambrose Finland RN, MSN, CDE Diabetes Coordinator Inpatient Diabetes Program [phone]

## 2012-03-02 NOTE — Progress Notes (Signed)
eLink Physician-Brief Progress Note Patient Name: DONELL TOMKINS DOB: 07-11-1956 MRN: [id number]  Date of Service  03/02/2012   HPI/Events of Note     eICU Interventions  Hypokalemia -repleted    Intervention Category Minor Interventions: Electrolytes abnormality - evaluation and management  ALVA,RAKESH V. 03/02/2012, 10:23 PM

## 2012-03-02 NOTE — Progress Notes (Signed)
Heparin per Pharmacy Indication: chest pain/ACS  Heparin level = <0.1 on 1000 units/hr Goal heparin level = 0.3-0.7  Heparin level is subtherapeutic. RN states there has been no interruption of the heparin this afternoon. Will increase the rate to 1200 units/hr and give a 2000 unit bolus. Will follow up heparin level with AM labs.

## 2012-03-02 NOTE — Progress Notes (Signed)
Name: Jordan Waller MRN: [id number] DOB: April 08, 1956    LOS: 1  Referring Provider:  ED / [facility] Reason for Referral:  DKA   PULMONARY / CRITICAL CARE MEDICINE  HPI:  56 y/o M with hx of DM on insulin pump, hx of plaque seen on co arteries few years ago in [place] NOS, daily 2-4 drink etoh user, HLD [facility]12 with a 3-4 day hx of vomiting, diarrhea, generalized pain, HA and elevated blood sugars.  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  Also patient reports significant drinking at conference.  Work up demonstrated ph 7.0 / pCO2 13 / HCO3 3.5, K 5.6, creatinine 1.20, lipase 810, lactate 1.8 and moderate acetone in blood.  Rx's with NS, IV insulin and Tx to [facility] for further care.      Brief patient description:  56 y/o M with DKA in setting of nausea / vomiting / diarrhea.  Events Since Admission: 8/12 - Admit with DKA    SUBJECTIVE/OVERNIGHT/INTERVAL HX  Remains unintubated. Gap closed. Bicarb improving but at 20. Wife at bedside concerned about volume overload state. TRopnins positive and cards consulted. On heparin  Vital Signs: Temp:  [97.4 F (36.3 C)-98.6 F (37 C)] 97.8 F (36.6 C) (08/13 1231) Pulse Rate:  [74-93] 75  (08/13 1600) Resp:  [11-19] 13  (08/13 1600) BP: (81-105)/(39-65) 98/58 mmHg (08/13 1600) SpO2:  [96 %-99 %] 99 % (08/13 1600) Weight:  [68.7 kg (151 lb 7.3 oz)] 68.7 kg (151 lb 7.3 oz) (08/12 1733)  Physical Examination: General:  wdwn adult male in NAD Neuro:  AAOx4, speech clear, MAE HEENT:  Mm pink/moist, no jvd Cardiovascular:  s1s2 rrr, no m/r/g Lungs:  resp's even/non-labored, lungs bilaterally clear Abdomen:  Round / soft, bsx4 active Musculoskeletal:  No acute deformities Skin:  Red face, no breakdown  Active Problems:  DKA (diabetic ketoacidoses)  Metabolic acidosis  Nausea & vomiting  Diarrhea  NSTEMI (non-ST elevated  myocardial infarction)  Acute myopericarditis  ASSESSMENT AND PLAN  PULMONARY  Lab 03/01/12 1234  PHART 7.021*  PCO2ART 13.4*  PO2ART 128.0*  HCO3 3.5*  O2SAT 97.0   CXR:  8/12>>>nad  A:   Maintaining airway. No acute issues  P:   Goal SpO2>92 Supplemental oxygen  CARDIOVASCULAR  Lab 03/02/12 0930 03/02/12 0213 03/01/12 2014 03/01/12 1045  TROPONINI 4.76*4.76* 4.20* 1.58* --  LATICACIDVEN -- -- -- 1.8  PROBNP -- -- -- --   ECG:  Pending>>> Lines:  ECHO 03/02/12: Distal septal and apical hypokinesis The cavity size was normal. Wall thickness was normal. Systolic function was normal. The estimated ejection fraction was in the range of 50% to 55%.  A:  NSTEMI - new  P:  Cards consult  RENAL  Lab 03/02/12 1303 03/02/12 0930 03/02/12 0645 03/02/12 0244 03/01/12 2334 03/01/12 2113  NA 136 -- 135 135 135 136  K 3.4* -- 3.2* -- -- --  CL 108 -- 109 110 110 108  CO2 18* -- 17* 15* 14* 13*  BUN 10 -- 13 13 15 16   CREATININE 0.68 -- 0.76 0.75 0.76 0.88  CALCIUM 7.8* -- 7.6* 7.6* 7.7* 7.6*  MG -- 1.9 -- -- -- --  PHOS -- 0.6* -- -- -- --   Intake/Output      08/12 0701 - 08/13 0700 08/13 0701 - 08/14 0700   I.V. (mL/kg) 7875.3 (114.6) 2114.1 (30.8)   IV Piggyback  400   Total Intake(mL/kg) 7875.3 (114.6) 2514.1 (36.6)   Urine (mL/kg/hr) 1650 (1) 700 (1.1)   Total Output 1650 700   Net +6225.3 +1814.1         Foley:  NA  A: DKA  With electrolyte issues  - 03/02/12: Gap closed. Bic 18. Phos critically low  P:   DKA protocol Correct Phos and K Trend BMP  GASTROINTESTINAL  Lab 03/01/12 1010  AST 14  ALT 18  ALKPHOS 112  BILITOT 0.5  PROT 8.7*  ALBUMIN 5.5*   Lipase 810  A: Nausea / Vomiting / Diarrhea.  History of recent ETOH intake / elevated lipase - rule out pancreatitis - however clinically benign abdomen.  P:   PRN zofran follow lipase   HEMATOLOGIC  Lab 03/01/12 2015 03/01/12 1010  HGB 13.8 17.7*  HCT 39.3 49.8  PLT 189 309  INR  -- --  APTT -- --   A:   No acute issues  P:  Trend CBC  INFECTIOUS  Lab 03/01/12 2015 03/01/12 1010  WBC 17.3* 20.6*  PROCALCITON <0.10 --   Cultures: 8/12 UA>>> 8/12 BCx2>>> 8/12 PCT>>>  Antibiotics: Defered  A:  Leukocytosis, likely reactive.  No clear source of infection.   P:   recheck pct 03/03/12 follow cultures hold abx for now  ENDOCRINE  Lab 03/02/12 1558 03/02/12 1453 03/02/12 1343 03/02/12 1213 03/02/12 1050  GLUCAP 148* 142* 151* 142* 148*   A:  DKA.  DM on insulin pump. P:   Per DKA protocol Diabetic educator consult in AM  NEUROLOGIC  A:  No acute Issues P:   monitor  BEST PRACTICE / DISPOSITION Level of Care:  ICU Primary Service:  PCCM Consultants:  None Code Status:  Full Code Diet:  NPO DVT Px:  Heparin GI Px:  None indicated Skin Integrity:  Intact Social / Family:  Wife updated at bedside 03/02/12      The patient is critically ill with multiple organ systems failure and requires high complexity decision making for assessment and support, frequent evaluation and titration of therapies, application of advanced monitoring technologies and extensive interpretation of multiple databases. Critical Care Time devoted to patient care services described in this note is 35 minutes.    Dr. Kalman Shan, M.D., [facility].C.P Pulmonary and Critical Care Medicine Staff Physician [facility] [place] [facility] Pager: [phone], If no answer or between  15:00h - 7:00h: call [phone]  03/02/2012 4:27 PM

## 2012-03-02 NOTE — Progress Notes (Signed)
With CK-MB of 13.3 MD on rounds made aware

## 2012-03-02 NOTE — Consult Note (Signed)
CARDIOLOGY CONSULT NOTE  Patient ID: Jordan Waller, MRN: [id number], DOB/AGE: September 09, 1955 56 y.o. Admit date: 03/01/2012   Date of Consult: 03/02/2012 Primary Cardiologist: None  Chief Complaint: nausea, vomiting Reason for Consult: NSTEMI   HPI: HPI: 56 y/o M with hx of DM on insulin pump, HLD, daily EtOH user, former smoker who [facility] with weakness, nausea, vomiting, generalized pain, and elevated blood sugars starting 02/29/12. He denies any formal cardiac diagnosis but reports a "360 chest x-ray" 2 years ago in [place] (?nuc vs CT) that showed plaque but he reports no f/u testing was necessary. He is a frequent traveler ([employer], [place], [place]) and last returned from a trip about 2 weeks ago. He attended a conference here in [place] with 20,000 people over the weekend with many international attendees - he notes some people had respiratory illnesses but denied any mass GI symptoms. His wife also noted overnight on Sunday 8/11 his breathing was more labored. Jordan Waller reports some exertional dyspnea which was unusual for him. He does feel chest pain upon inspiration. He denies any chest discomfort at rest or with exertion. Work up on admission demonstrated pH 7.0 / pCO2 13 / HCO3 3.5, WBC 20.6, K 5.6, creatinine 1.20, lipase 810, lactate 1.8 and moderate acetone in blood. He was transferred to [facility] for treatment of DKA and metabolic acidosis with fluid resuscitation & IV insulin. Lipase has trended down to 223. Procalcitonin is <0.10.  As part of workup, troponins were drawn showing trend of 1.58 -> 4.20 overnight. His BP remains soft in the 90's systolic. He received 325mg  ASA last night and was started on heparin per pharmacy for ACS protocol overnight. He remains comfortable at present. Note that he is not tachycardic, tachypnic or hypoxic and he denies any LEE or leg pain.  Past Medical History  Diagnosis Date  . Diabetes mellitus   . High cholesterol         Most Recent Cardiac Studies: He denies any formal cardiac diagnosis but reports a "360 chest x-ray" 2 years ago (?nuc vs cardiac CT) that showed plaque but no f/u testing was necessary per pt - [facility] in [place], [place].   Surgical History:  Past Surgical History  Procedure Date  . Rotator cuff repair      Home Meds: Prior to Admission medications   Medication Sig Start Date End Date Taking? Authorizing Provider  ibuprofen (ADVIL,MOTRIN) 200 MG tablet Take 600 mg by mouth every 8 (eight) hours as needed. For poby pain   Yes Historical Provider, MD  Insulin Human (INSULIN PUMP) 100 unit/ml SOLN Inject into the skin. Patient uses Novolog in pump on sliding scale up to 60 units daily as directed   Yes Historical Provider, MD  rosuvastatin (CRESTOR) 10 MG tablet Take 10 mg by mouth daily.   Yes Historical Provider, MD    Inpatient Medications:     . morphine      . antiseptic oral rinse  15 mL Mouth Rinse q12n4p  . aspirin EC  325 mg Oral Once  . chlorhexidine  15 mL Mouth Rinse BID  . heparin  3,000 Units Intravenous Once  .  morphine injection  4 mg Intravenous Once  . morphine      . ondansetron (ZOFRAN) IV  4 mg Intravenous Once  . pneumococcal 23 valent vaccine  0.5 mL Intramuscular Tomorrow-1000  . potassium chloride  10 mEq Intravenous Q1 Hr x 4  .  sodium chloride  1,000 mL Intravenous Once  . sodium chloride  1,000 mL Intravenous Once  . DISCONTD: sodium chloride   Intravenous STAT  . DISCONTD: antiseptic oral rinse  15 mL Mouth Rinse BID  . DISCONTD: chlorhexidine  15 mL Mouth Rinse BID  . DISCONTD: heparin  5,000 Units Subcutaneous Q8H  . DISCONTD: insulin regular      . DISCONTD: potassium chloride  40 mEq Oral Once    Allergies: No Known Allergies  History   Social History  . Marital Status: Married    Spouse Name: N/A    Number of Children: N/A  . Years of Education: N/A   Occupational History  . Not on file.   Social History Main Topics  . Smoking  status: Former Smoker -- 20 years  . Smokeless tobacco: Not on file  . Alcohol Use: Yes     2-4 drinks per day  . Drug Use: No  . Sexually Active: Not on file   Other Topics Concern  . Not on file   Social History Narrative  . No narrative on file     Family History  Problem Relation Age of Onset  . Colon cancer Mother   . Colon cancer Sister   . Coronary artery disease Neg Hx     No first degree relatives with CAD     Review of Systems: General: negative for chills, fever, night sweats or weight changes.  Cardiovascular: no syncope, palpitations, orthopnea, PND. Otherwise see above Dermatological: negative for rash Respiratory: negative for cough or wheezing Urologic: negative for hematuria Abdominal: negative for diarrhea, bright red blood per rectum, melena, or hematemesis. +n/v as above Neurologic: negative for visual changes, syncope, or dizziness but + generalized weakness All other systems reviewed and are otherwise negative except as noted above.  Labs:  [facility] 03/02/12 0213 03/01/12 2014  CKTOTAL -- --  CKMB -- --  TROPONINI 4.20* 1.58*   Lab Results  Component Value Date   WBC 17.3* 03/01/2012   HGB 13.8 03/01/2012   HCT 39.3 03/01/2012   MCV 91.0 03/01/2012   PLT 189 03/01/2012     Lab 03/02/12 0645 03/01/12 1010  NA 135 --  K 3.2* --  CL 109 --  CO2 17* --  BUN 13 --  CREATININE 0.76 --  CALCIUM 7.6* --  PROT -- 8.7*  BILITOT -- 0.5  ALKPHOS -- 112  ALT -- 18  AST -- 14  GLUCOSE 149* --   Radiology/Studies:  1. Chest 2 View 03/01/2012  *RADIOLOGY REPORT*  Clinical Data: Vomiting, dehydration, history of diabetes  CHEST - 2 VIEW  Comparison: None.  Findings: Normal cardiac silhouette and mediastinal contours.  No focal parenchymal masses.  No pleural effusion or pneumothorax.  No acute osseous abnormalities.  IMPRESSION: No acute cardiopulmonary disease.  Original Report Authenticated By: Waynard Reeds, M.D.   EKG:  1) 03/01/12 - NSR 84bpm,  diffuse ST elevation I, II, III, avF, V3-V6 (?early repolarization). No prior to compare to 2) 03/02/12 - NSR 80bpm again diffuse ST elevation as above  Physical Exam: Blood pressure 98/60, pulse 81, temperature 97.4 F (36.3 C), temperature source Oral, resp. rate 13, height 5\' 7"  (1.702 m), weight 151 lb 7.3 oz (68.7 kg), SpO2 97.00%. General: Well developed, well nourished middle-aged WM in no acute distress. Head: Normocephalic, atraumatic, sclera non-icteric, no xanthomas, nares are without discharge.  Neck: Negative for carotid bruits. JVD not elevated. Lungs: Clear bilaterally to auscultation without wheezes,  rales, or rhonchi. Breathing is unlabored. Heart: RRR with S1 S2. No murmurs, rubs, or gallops appreciated. Abdomen: Soft, non-tender, non-distended with normoactive bowel sounds. No hepatomegaly. No rebound/guarding. No obvious abdominal masses. Msk:  Strength and tone appear normal for age. Extremities: No clubbing or cyanosis. No edema or erythema. No palpable cords.  Distal pedal pulses are 2+ and equal bilaterally. Neuro: Alert and oriented X 3. Moves all extremities spontaneously. Psych:  Responds to questions appropriately with a normal affect.   Assessment and Plan:   1. Diabetic ketoacidosis with h/o IDDM - current course being managed by PCCM, including lytes/fluids. 2. NSTEMI with possible myopericarditis - unclear if this is cause or effect of #1. Patient denies formal hx of CAD but reports "plaque" on some sort of prior study in [place] 2 years ago. Will try to obtain those records from [place]. His EKG and pleuritic pain are consistent with myopericarditis. Agree with 2D echo. Given cardiac risk factors, he will need ischemic eval once recovered from acute DKA. If +WMA on echo, will need cardiac catheterization. If no WMA and normal LV function on echo, would likely do nuclear stress testing. Discussed management of pericarditis with Dr. Antoine Poche - would continue heparin  for now while we trend enzymes, start low dose daily ASA, and start colchicine 0.6mg  BID. Will hold off on additional NSAIDs for now with recent dehydration (pain is not currently severe). Hold off on BB given hypotension. Recommend resuming statin when OK with primary team. Check lipid panel in AM. 3. Elevated lipase - primary team doubts pancreatitis. Trending down. 4. Moderate EtOH use (2-4 drinks daily) - consider CIWA protocol initiation.  Signed, Dayna Dunn PA-C 03/02/2012, 10:08 AM   History and all data above reviewed.  Patient examined.  I agree with the findings as above.   Pleuritic chest pain at the time of nausea/vomitting.  Eventually found to have DKA.  Trop elevated and EKG consistent with pericarditis. The patient exam reveals COR:RRR, no rub  ,  Lungs: Clear  ,  Abd: Positive bowel sounds, no rebound no guarding, Ext No edema.  .  All available labs, radiology testing, previous records reviewed. Agree with documented assessment and plan. Pericarditis with mild pain.  A viral illness might have been a trigger for this DKA.  I will check an echocardiogram.  I will start colchicine.  I will avoid NSAIDS with renal function since he is not particularly symptomatic.  OK for heparin and ASA pending the echo and follow up labs.  We have not completely excluded CAD as an etiology.  Risk of hemorrhagic conversion is low.  We will need records from [facility] as he recently had screeening cardiology tests that might included a cardiac CT.  Fayrene Fearing [facility]  10:42 AM  03/02/2012

## 2012-03-02 NOTE — Progress Notes (Signed)
ANTICOAGULATION CONSULT NOTE - Initial Consult  Pharmacy Consult for Heparin Indication: chest pain/ACS  No Known Allergies  Patient Measurements: Height: 5\' 7"  (170.2 cm) Weight: 151 lb 7.3 oz (68.7 kg) IBW/kg (Calculated) : 66.1   Vital Signs: Temp: 98.6 F (37 C) (08/12 2349) Temp src: Oral (08/12 2349) BP: 87/50 mmHg (08/13 0000) Pulse Rate: 90  (08/13 0000)  Labs:  [facility] Phil[facility] 03/02/12 0213 03/01/12 2334 03/01/12 2113 03/01/12 2015 03/01/12 2014 03/01/12 1010  HGB -- -- -- 13.8 -- 17.7*  HCT -- -- -- 39.3 -- 49.8  PLT -- -- -- 189 -- 309  APTT -- -- -- -- -- --  LABPROT -- -- -- -- -- --  INR -- -- -- -- -- --  HEPARINUNFRC -- -- -- -- -- --  CREATININE -- 0.76 0.88 -- 0.80 --  CKTOTAL -- -- -- -- -- --  CKMB -- -- -- -- -- --  TROPONINI 4.20* -- -- -- 1.58* --    Estimated Creatinine Clearance: 94.1 ml/min (by C-G formula based on Cr of 0.76).   Medical History: Past Medical History  Diagnosis Date  . Diabetes mellitus   . High cholesterol     Medications:  Prescriptions prior to admission  Medication Sig Dispense Refill  . ibuprofen (ADVIL,MOTRIN) 200 MG tablet Take 600 mg by mouth every 8 (eight) hours as needed. For poby pain      . Insulin Human (INSULIN PUMP) 100 unit/ml SOLN Inject into the skin. Patient uses Novolog in pump on sliding scale up to 60 units daily as directed      . rosuvastatin (CRESTOR) 10 MG tablet Take 10 mg by mouth daily.        Assessment: 56 yo male with elevated cardiac markers for Heparin  Goal of Therapy:  Heparin level 0.3-0.7 units/ml Monitor platelets by anticoagulation protocol: Yes   Plan:  Heparin 3000 units Iv bolus, then 850 units/hr Check heparin level in 6 hours.  Eddie Candle 03/02/2012,3:11 AM

## 2012-03-02 NOTE — Progress Notes (Signed)
Pt's with critical phosphorus value of 0.6 result relayed to MD during rounds.

## 2012-03-02 NOTE — Progress Notes (Signed)
1:49 pm:  Noted patient remains on IV insulin drip.  Current BMET pending.  Once decision made to transition patient back to his insulin pump, will need to overlap the IV insulin drip and insulin pump by 1-2 hours.  Then, can d/c IV insulin drip and have patient manage insulin pump independently.  Spoke to RN, Darien Ramus  RN aware of this plan.  Need to await results of BMET and orders from MD.  Once patient resumes his insulin pump, will need to enter Insulin Pump orders into computer.  Will follow. Ambrose Finland RN, MSN, CDE Diabetes Coordinator Inpatient Diabetes Program [phone]

## 2012-03-02 NOTE — Progress Notes (Signed)
ANTICOAGULATION CONSULT NOTE - Follow Up Consult  Pharmacy Consult for Heaprin Indication: chest pain/ACS  No Known Allergies  Patient Measurements: Height: 5\' 7"  (170.2 cm) Weight: 151 lb 7.3 oz (68.7 kg) IBW/kg (Calculated) : 66.1    Vital Signs: Temp: 97.4 F (36.3 C) (08/13 0832) Temp src: Oral (08/13 0832) BP: 98/56 mmHg (08/13 1000) Pulse Rate: 81  (08/13 1000)  Labs:  Basename 03/02/12 0930 03/02/12 0645 03/02/12 0244 03/02/12 0213 03/01/12 2334 03/01/12 2015 03/01/12 2014 03/01/12 1010  HGB -- -- -- -- -- 13.8 -- 17.7*  HCT -- -- -- -- -- 39.3 -- 49.8  PLT -- -- -- -- -- 189 -- 309  APTT -- -- -- -- -- -- -- --  LABPROT -- -- -- -- -- -- -- --  INR -- -- -- -- -- -- -- --  HEPARINUNFRC 0.10* -- -- -- -- -- -- --  CREATININE -- 0.76 0.75 -- 0.76 -- -- --  CKTOTAL -- -- -- -- -- -- -- --  CKMB -- -- -- -- -- -- -- --  TROPONINI -- -- -- 4.20* -- -- 1.58* --    Estimated Creatinine Clearance: 94.1 ml/min (by C-G formula based on Cr of 0.76).    Assessment: 56 yo M admitted 8/12 in DKA w 3-4 days of increased N/V/D and malaise. Pharmacy dosing heparin for elevated cardiac markers. HL this am SUB-therapeutic at < 0.1 on 850 units/hr. No bleeding noted, no infusion line issues per RN.   Goal of Therapy:  Heparin level 0.3-0.7 units/ml Monitor platelets by anticoagulation protocol: Yes   Plan:  - Give heparin 2000 unit bolus now - Increase heparin gtt to 1000 units/hr - F/u HL in 6 hrs - Daily HL, CBC - F/u s/sx bleeding  Laurence Slate 03/02/2012,10:36 AM

## 2012-03-03 DIAGNOSIS — I214 Non-ST elevation (NSTEMI) myocardial infarction: Secondary | ICD-10-CM

## 2012-03-03 DIAGNOSIS — E1010 Type 1 diabetes mellitus with ketoacidosis without coma: Secondary | ICD-10-CM

## 2012-03-03 LAB — BASIC METABOLIC PANEL
BUN: 6 mg/dL (ref 6–23)
BUN: 5 mg/dL — ABNORMAL LOW (ref 6–23)
BUN: 7 mg/dL (ref 6–23)
BUN: 9 mg/dL (ref 6–23)
BUN: 8 mg/dL (ref 6–23)
CO2: 20 mEq/L (ref 19–32)
CO2: 21 mEq/L (ref 19–32)
CO2: 18 mEq/L — ABNORMAL LOW (ref 19–32)
CO2: 17 mEq/L — ABNORMAL LOW (ref 19–32)
CO2: 19 mEq/L (ref 19–32)
CO2: 22 mEq/L (ref 19–32)
CO2: 23 mEq/L (ref 19–32)
CO2: 25 mEq/L (ref 19–32)
Calcium: 7.7 mg/dL — ABNORMAL LOW (ref 8.4–10.5)
Calcium: 7.8 mg/dL — ABNORMAL LOW (ref 8.4–10.5)
Calcium: 8 mg/dL — ABNORMAL LOW (ref 8.4–10.5)
Calcium: 7.8 mg/dL — ABNORMAL LOW (ref 8.4–10.5)
Chloride: 105 mEq/L (ref 96–112)
Chloride: 102 mEq/L (ref 96–112)
Chloride: 100 mEq/L (ref 96–112)
Chloride: 103 mEq/L (ref 96–112)
Chloride: 102 mEq/L (ref 96–112)
Chloride: 103 mEq/L (ref 96–112)
Chloride: 105 mEq/L (ref 96–112)
Creatinine, Ser: 0.63 mg/dL (ref 0.50–1.35)
Creatinine, Ser: 0.59 mg/dL (ref 0.50–1.35)
Creatinine, Ser: 0.68 mg/dL (ref 0.50–1.35)
GFR calc Af Amer: >90 mL/min (ref >90)
GFR calc non Af Amer: >90 mL/min (ref >90)
GFR calc non Af Amer: >90 mL/min (ref >90)
Glucose, Bld: 150 mg/dL — ABNORMAL HIGH (ref 70–99)
Glucose, Bld: 135 mg/dL — ABNORMAL HIGH (ref 70–99)
Glucose, Bld: 341 mg/dL — ABNORMAL HIGH (ref 70–99)
Glucose, Bld: 209 mg/dL — ABNORMAL HIGH (ref 70–99)
Glucose, Bld: 220 mg/dL — ABNORMAL HIGH (ref 70–99)
Potassium: 3.1 mEq/L — ABNORMAL LOW (ref 3.5–5.1)
Potassium: 3.9 mEq/L (ref 3.5–5.1)
Potassium: 4.4 mEq/L (ref 3.5–5.1)
Potassium: 4 mEq/L (ref 3.5–5.1)
Potassium: 3.8 mEq/L (ref 3.5–5.1)
Sodium: 135 mEq/L (ref 135–145)
Sodium: 131 mEq/L — ABNORMAL LOW (ref 135–145)
Sodium: 134 mEq/L — ABNORMAL LOW (ref 135–145)
Sodium: 134 mEq/L — ABNORMAL LOW (ref 135–145)

## 2012-03-03 LAB — CBC
HCT: 33.6 % — ABNORMAL LOW (ref 39.0–52.0)
HCT: 36.8 % — ABNORMAL LOW (ref 39.0–52.0)
Hemoglobin: 12.1 g/dL — ABNORMAL LOW (ref 13.0–17.0)
MCH: 32.3 pg (ref 26.0–34.0)
MCHC: 36.1 g/dL — ABNORMAL HIGH (ref 30.0–36.0)
MCV: 89.3 fL (ref 78.0–100.0)
RDW: 12.9 % (ref 11.5–15.5)
WBC: 9.6 10*3/uL (ref 4.0–10.5)

## 2012-03-03 LAB — GLUCOSE, CAPILLARY
Glucose-Capillary: 171 mg/dL — ABNORMAL HIGH (ref 70–99)
Glucose-Capillary: 162 mg/dL — ABNORMAL HIGH (ref 70–99)
Glucose-Capillary: 148 mg/dL — ABNORMAL HIGH (ref 70–99)
Glucose-Capillary: 162 mg/dL — ABNORMAL HIGH (ref 70–99)
Glucose-Capillary: 180 mg/dL — ABNORMAL HIGH (ref 70–99)
Glucose-Capillary: 241 mg/dL — ABNORMAL HIGH (ref 70–99)

## 2012-03-03 LAB — LIPID PANEL
Cholesterol: 108 mg/dL (ref 0–200)
HDL: 54 mg/dL (ref >39)
Total CHOL/HDL Ratio: 2 RATIO
Triglycerides: 55 mg/dL (ref <150)

## 2012-03-03 LAB — HEPATIC FUNCTION PANEL
ALT: 10 U/L (ref 0–53)
AST: 22 U/L (ref 0–37)
Albumin: 2.8 g/dL — ABNORMAL LOW (ref 3.5–5.2)
Total Bilirubin: 0.8 mg/dL (ref 0.3–1.2)

## 2012-03-03 LAB — MAGNESIUM: Magnesium: 1.7 mg/dL (ref 1.5–2.5)

## 2012-03-03 LAB — URINE CULTURE
Colony Count: NO GROWTH
Culture: NO GROWTH

## 2012-03-03 LAB — CARDIAC PANEL(CRET KIN+CKTOT+MB+TROPI)
CK, MB: 6.3 ng/mL (ref 0.3–4.0)
Relative Index: INVALID (ref 0.0–2.5)
Total CK: 91 U/L (ref 7–232)

## 2012-03-03 LAB — HEPARIN LEVEL (UNFRACTIONATED)
Heparin Unfractionated: 0.1 IU/mL — ABNORMAL LOW (ref 0.30–0.70)
Heparin Unfractionated: 0.39 IU/mL (ref 0.30–0.70)
Heparin Unfractionated: 0.37 IU/mL (ref 0.30–0.70)

## 2012-03-03 LAB — PROCALCITONIN: Procalcitonin: <0.1 ng/mL

## 2012-03-03 MED ORDER — HEPARIN BOLUS VIA INFUSION
2000.0000 [IU] | Freq: Once | INTRAVENOUS | Status: AC
  Administered 2012-03-03: 2000 [IU] via INTRAVENOUS
  Filled 2012-03-03: qty 2000

## 2012-03-03 MED ORDER — SODIUM CHLORIDE 0.9 % IV SOLN
INTRAVENOUS | Status: AC
  Administered 2012-03-03: 15:00:00 via INTRAVENOUS

## 2012-03-03 MED ORDER — DEXTROSE 50 % IV SOLN: 25.0000 mL | INTRAVENOUS | Status: DC | PRN

## 2012-03-03 MED ORDER — MAGNESIUM SULFATE 40 MG/ML IJ SOLN
2.0000 g | Freq: Once | INTRAMUSCULAR | Status: AC
  Administered 2012-03-03: 2 g via INTRAVENOUS
  Filled 2012-03-03: qty 50

## 2012-03-03 MED ORDER — POTASSIUM PHOSPHATE DIBASIC 3 MMOLE/ML IV SOLN
20.0000 mmol | Freq: Once | INTRAVENOUS | Status: AC
  Administered 2012-03-03: 20 mmol via INTRAVENOUS
  Filled 2012-03-03: qty 6.67

## 2012-03-03 MED ORDER — DOXYCYCLINE HYCLATE 100 MG PO TABS
100.0000 mg | ORAL_TABLET | Freq: Two times a day (BID) | ORAL | Status: DC
  Administered 2012-03-03 – 2012-03-06 (×7): 100 mg via ORAL
  Filled 2012-03-03 (×8): qty 1

## 2012-03-03 MED ORDER — DEXTROSE-NACL 5-0.45 % IV SOLN
INTRAVENOUS | Status: DC
  Administered 2012-03-03 – 2012-03-04 (×3): via INTRAVENOUS

## 2012-03-03 MED ORDER — MAGNESIUM SULFATE 50 % IJ SOLN
2.0000 g | Freq: Once | INTRAVENOUS | Status: DC
  Filled 2012-03-03: qty 4

## 2012-03-03 MED ORDER — POTASSIUM CHLORIDE 10 MEQ/100ML IV SOLN
10.0000 meq | INTRAVENOUS | Status: AC
  Administered 2012-03-03 (×2): 10 meq via INTRAVENOUS
  Filled 2012-03-03: qty 200

## 2012-03-03 MED ORDER — SODIUM CHLORIDE 0.9 % IV SOLN
INTRAVENOUS | Status: DC
  Administered 2012-03-03: 15:00:00 via INTRAVENOUS

## 2012-03-03 MED ORDER — POTASSIUM CHLORIDE 10 MEQ/100ML IV SOLN
INTRAVENOUS | Status: AC
  Administered 2012-03-03: 10 meq
  Filled 2012-03-03: qty 100

## 2012-03-03 MED ORDER — INSULIN PUMP
Freq: Three times a day (TID) | SUBCUTANEOUS | Status: DC
  Administered 2012-03-03: 12:00:00 via SUBCUTANEOUS
  Filled 2012-03-03: qty 1

## 2012-03-03 MED ORDER — SODIUM CHLORIDE 0.9 % IV SOLN
INTRAVENOUS | Status: DC
  Administered 2012-03-03: 7.5 [IU]/h via INTRAVENOUS
  Administered 2012-03-03: 5.1 [IU]/h via INTRAVENOUS
  Administered 2012-03-03: 5.3 [IU]/h via INTRAVENOUS
  Administered 2012-03-03: 2.9 [IU]/h via INTRAVENOUS
  Filled 2012-03-03: qty 1

## 2012-03-03 MED ORDER — DOXYCYCLINE HYCLATE 100 MG PO TABS: 100.0000 mg | ORAL_TABLET | Freq: Two times a day (BID) | ORAL | Status: DC

## 2012-03-03 MED ORDER — POTASSIUM CHLORIDE CRYS ER 20 MEQ PO TBCR
40.0000 meq | EXTENDED_RELEASE_TABLET | Freq: Once | ORAL | Status: AC
  Administered 2012-03-03: 40 meq via ORAL
  Filled 2012-03-03: qty 2

## 2012-03-03 MED ORDER — POTASSIUM CHLORIDE CRYS ER 10 MEQ PO TBCR
30.0000 meq | EXTENDED_RELEASE_TABLET | ORAL | Status: AC
  Administered 2012-03-03 (×2): 30 meq via ORAL
  Filled 2012-03-03 (×2): qty 3

## 2012-03-03 NOTE — Progress Notes (Signed)
Pt self administerd 6.75 units of insulin via home insulin pump. Glucose 352 after lunch.

## 2012-03-03 NOTE — Progress Notes (Signed)
eLink Physician-Brief Progress Note Patient Name: Jordan Waller DOB: July 07, 1956 MRN: [id number]  Date of Service  03/03/2012   HPI/Events of Note   Hypokalemia  eICU Interventions  Potassium replaced   Intervention Category Intermediate Interventions: Electrolyte abnormality - evaluation and management  DETERDING,ELIZABETH 03/03/2012, 2:02 AM

## 2012-03-03 NOTE — Progress Notes (Signed)
eLink Physician-Brief Progress Note Patient Name: Jordan Waller DOB: 1956/05/06 MRN: [id number]  Date of Service  03/03/2012   HPI/Events of Note     eICU Interventions  Hypokalemia -repleted    Intervention Category Minor Interventions: Electrolytes abnormality - evaluation and management  Danner Paulding V. 03/03/2012, 9:49 PM

## 2012-03-03 NOTE — Progress Notes (Signed)
Spoke to RN, Radio broadcast assistant by telephone this morning.  RN told me Dr. Marchelle Gearing wants to transition patient back to his insulin pump this morning.  Anion Gap 8 by BMET this morning (CO2 21).  Assisted RN in entering insulin pump order set into the computer.  Reminded RN to overlap insulin drip and insulin pump by 1 hour.  Insulin drip can then be d/c'd after insulin pump on and running for 1 hour.  Reminded RN to document all insulin boluses given by patient and to check patient's CBGs tid ac, HS, and 2am CBG with hospital CBG meter.  Also reminded RN to complete insulin pump management flow sheet in DocFlowsheets section of [facility].  Will follow. Ambrose Finland RN, MSN, CDE Diabetes Coordinator Inpatient Diabetes Program [phone]

## 2012-03-03 NOTE — Progress Notes (Signed)
Name: Jordan Waller MRN: [id number] DOB: 20-Aug-1955    LOS: 2  Referring Provider:  ED / [facility] Reason for Referral:  DKA   PULMONARY / CRITICAL CARE MEDICINE  HPI:  56 y/o M with hx of DM on insulin pump, hx of plaque seen on co arteries few years ago in [place] NOS, daily 2-4 drink etoh user, HLD presented to [facility] on 8/12 with a 3-4 day hx of vomiting, diarrhea, generalized pain, HA and elevated blood sugars.  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  Also patient reports significant drinking at conference.  Work up demonstrated ph 7.0 / pCO2 13 / HCO3 3.5, K 5.6, creatinine 1.20, lipase 810, lactate 1.8 and moderate acetone in blood.  Rx's with NS, IV insulin and Tx to [facility] for further care.      Brief patient description:  56 y/o M with DKA in setting of nausea / vomiting / diarrhea.  Events Since Admission: 8/12 - Admit with DKA 8/13 -  Remains unintubated. Gap closed. Bicarb improving but at 20. Wife at bedside concerned about volume overload state. TRopnins positive and cards consulted. On heparin    SUBJECTIVE/OVERNIGHT/INTERVAL HX  8/14 - came off d5w and insulin gtt and took home insuline regimen infusion but promptly bicarb dropped and gap increased. Cards plans cath 8/15. Wife concerned about hair follicle infection in neck; he is refusing doxy. Hungry wants to eat. STill having costant chest pain with pleuritic nature  Vital Signs: Temp:  [98.3 F (36.8 C)-99.8 F (37.7 C)] 98.3 F (36.8 C) (08/14 1213) Pulse Rate:  [74-104] 94  (08/14 1300) Resp:  [12-19] 18  (08/14 1300) BP: (98-132)/(53-79) 105/78 mmHg (08/14 1300) SpO2:  [95 %-99 %] 98 % (08/14 1300)  Physical Examination: General:  wdwn adult male in NAD Neuro:  AAOx4, speech clear, MAE HEENT:  Mm pink/moist, no jvd Cardiovascular:  s1s2 rrr, no m/r/g Lungs:  resp's even/non-labored, lungs bilaterally  clear Abdomen:  Round / soft, bsx4 active Musculoskeletal:  No acute deformities Skin:  Red face, no breakdown  Active Problems:  DKA (diabetic ketoacidoses)  Metabolic acidosis  Nausea & vomiting  Diarrhea  NSTEMI (non-ST elevated myocardial infarction)  Acute myopericarditis  ASSESSMENT AND PLAN  PULMONARY  Lab 03/01/12 1234  PHART 7.021*  PCO2ART 13.4*  PO2ART 128.0*  HCO3 3.5*  O2SAT 97.0   CXR:  8/12>>>nad  A:   Maintaining airway. No acute issues  P:   Goal SpO2>92 Supplemental oxygen  CARDIOVASCULAR  Lab 03/03/12 0936 03/02/12 1628 03/02/12 0930 03/02/12 0213 03/01/12 2014 03/01/12 1045  TROPONINI 5.06* 3.66* 4.76*4.76* 4.20* 1.58* --  LATICACIDVEN -- -- -- -- -- 1.8  PROBNP -- -- -- -- -- --   ECG:  Pending>>> Lines:  ECHO 03/02/12: Distal septal and apical hypokinesis The cavity size was normal. Wall thickness was normal. Systolic function was normal. The estimated ejection fraction was in the range of 50% to 55%.  A:  NSTEMI - new  - on 03/03/12: ongoing constant chest pain since admit pleuritic nature but troponin this AM increased  P:  Per Cards consult ; reconsulted again in PM due to rising troponin Cycle enzymes again ? Add nitrates (informed Daun Peacock) ? Go to cath lab now   RENAL  Lab 03/03/12 1304 03/03/12 0930 03/03/12 0450 03/03/12 0047 03/02/12 2112 03/02/12 0930  NA 133* 135 136 135 135 --  K 3.9 3.4* -- -- -- --  CL 102 105 107 105 106 --  CO2 18* 21 21 20 21  --  BUN 7 5* 6 6 8  --  CREATININE 0.59 0.63 0.62 0.71 0.71 --  CALCIUM 7.8* 7.8* 7.8* 7.7* 7.7* --  MG -- -- 1.7 -- -- 1.9  PHOS -- -- 1.2* -- -- 0.6*   Intake/Output      08/13 0701 - 08/14 0700 08/14 0701 - 08/15 0700   P.O. 150    I.V. (mL/kg) 5699.2 (83) 760.9 (11.1)   IV Piggyback 1547 756.7   Total Intake(mL/kg) 7396.2 (107.7) 1517.6 (22.1)   Urine (mL/kg/hr) 3525 (2.1) 1600 (3.1)   Total Output 3525 1600   Net +3871.2 -82.4         Foley:  NA  A: DKA   With electrolyte issues  - 03/02/12: Gap closed. Bic 18. Phos critically low - 8/14: Transitioned off insuling gtt but gap worse (13) and bicarb (18) worse and glucose 300s.  So, Mild DKAAlso, phos and mag low  P:   Restart DKA protocol Correct Phos and K Trend BMP  GASTROINTESTINAL  Lab 03/03/12 0930 03/01/12 1010  AST 22 14  ALT 10 18  ALKPHOS 55 112  BILITOT 0.8 0.5  PROT 5.2* 8.7*  ALBUMIN 2.8* 5.5*   Lipase 810  A: Nausea / Vomiting / Diarrhea.  History of recent ETOH intake / elevated lipase - rule out pancreatitis - however clinically benign abdomen.  - on 8/12: Lipase 810  - on 03/03/12 : lipase 63. Probably had mild pancreatitis, now resolved  P:   PRN zofran   HEMATOLOGIC  Lab 03/03/12 0450 03/01/12 2015 03/01/12 1010  HGB 12.1* 13.8 17.7*  HCT 33.6* 39.3 49.8  PLT 148* 189 309  INR -- -- --  APTT -- -- --   A:   Anemia of critical illness  P:  - PRBC for hgb </= 6.9gm%    - exceptions are   -  if ACS susepcted/confirmed then transfuse for hgb </= 8.0gm%,  or    -  If septic shock first 24h and scvo2 < 70% then transfuse for hgb </= 9.0gm%   - active bleeding with hemodynamic instability, then transfuse regardless of hemoglobin value   At at all times try to transfuse 1 unit prbc as possible with exception of active hemorrhage    INFECTIOUS  Lab 03/03/12 0500 03/03/12 0450 03/01/12 2015 03/01/12 1010  WBC -- 9.6 17.3* 20.6*  PROCALCITON <0.10 -- <0.10 --   Cultures: 8/12 UA>>> 8/12 BCx2>>> 8/12 PCT>>>  Results for orders placed during the hospital encounter of 03/01/12  MRSA PCR SCREENING     Status: Normal   Collection Time   03/01/12  5:30 PM      Component Value Range Status Comment   MRSA by PCR NEGATIVE  NEGATIVE Final   CULTURE, BLOOD (ROUTINE X 2)     Status: Normal (Preliminary result)   Collection Time   03/01/12  8:03 PM      Component Value Range Status Comment   Specimen Description BLOOD RIGHT ARM   Final    Special Requests  BOTTLES DRAWN AEROBIC AND ANAEROBIC 10CC EACH   Final    Culture  Setup Time 03/02/2012 01:51   Final    Culture     Final    Value:        BLOOD CULTURE RECEIVED NO GROWTH TO DATE CULTURE WILL BE HELD FOR 5 DAYS BEFORE ISSUING A FINAL NEGATIVE REPORT  Report Status PENDING   Incomplete   CULTURE, BLOOD (ROUTINE X 2)     Status: Normal (Preliminary result)   Collection Time   03/01/12  8:10 PM      Component Value Range Status Comment   Specimen Description BLOOD RIGHT HAND   Final    Special Requests BOTTLES DRAWN AEROBIC ONLY 8CC   Final    Culture  Setup Time 03/02/2012 10:21   Final    Culture     Final    Value:        BLOOD CULTURE RECEIVED NO GROWTH TO DATE CULTURE WILL BE HELD FOR 5 DAYS BEFORE ISSUING A FINAL NEGATIVE REPORT   Report Status PENDING   Incomplete   URINE CULTURE     Status: Normal   Collection Time   03/02/12  2:30 PM      Component Value Range Status Comment   Specimen Description URINE, CLEAN CATCH   Final    Special Requests NONE   Final    Culture  Setup Time 03/02/2012 15:38   Final    Colony Count NO GROWTH   Final    Culture NO GROWTH   Final    Report Status 03/03/2012 FINAL   Final      Antibiotics: Anti-infectives    None       A:  PCT trend normal but he does have hair follicle ? Infection on neck. Has been scratching. Wife wants him to have doxy. He wants to defer   P:   STart empiric doxycycline x 5 days  ENDOCRINE  Lab 03/03/12 1206 03/03/12 1101 03/03/12 0958 03/03/12 0809 03/03/12 0702  GLUCAP 261* 241* 180* 162* 164*   A:  DKA.  DM on insulin pump. P:   Per DKA protocol Diabetic educator consult in AM  NEUROLOGIC  A:  No acute Issues P:   monitor  BEST PRACTICE / DISPOSITION Level of Care:  ICU Primary Service:  PCCM Consultants:  None Code Status:  Full Code Diet:  NPO DVT Px:  Heparin GI Px:  None indicated Skin Integrity:  Intact Social / Family:  Wife updated at bedside 03/02/12 and 03/03/12; she is very  concerned and anxious about his health      The patient is critically ill with multiple organ systems failure and requires high complexity decision making for assessment and support, frequent evaluation and titration of therapies, application of advanced monitoring technologies and extensive interpretation of multiple databases. Critical Care Time devoted to patient care services described in this note is 35 minutes.    Dr. Kalman Shan, M.D., [facility].C.P Pulmonary and Critical Care Medicine Staff Physician [facility] Pager: [phone], If no answer or between  15:00h - 7:00h: call [phone]  03/03/2012 2:24 PM

## 2012-03-03 NOTE — Progress Notes (Addendum)
Called by RN about patient's CBG.  CBG taken by patient on his home meter was 352 mg/dl at ~1OX.  BMET was drawn.  Noted CO2 was down to 18 and calculated anion gap was 13.  Upon arrival to unit, was told by RN covering for [facility] that Dr. Marchelle Gearing wants to have patient stop his insulin pump and resume the DKA protocol along with IVF and IV insulin drip.  Agree.  Recommend we initiate IV insulin drip until acidosis is clear and CO2 normalized.  Once patient's CO2 WNL, recommend we transition patient to SQ Lantus and Novolog instead of resuming insulin pump.  Patient can follow up with his endocrinologist, Dr. Sharl Ma (and Dr. Daune Perch insulin pump trainer), right after d/c to investigate if his insulin pump is malfunctioning.   Basal/Bolus regimen off insulin pump would be the following:  Lantus 24 units daily (make sure to give 1 hour before insulin drip d/c'd)  Novolog Moderate SSI tid ac + HS  Novolog meal coverage- Novolog 6 units with meals (may want to start with Novolog 4 units tid with meals and titrate as needed).  1453pm Addendum: Spoke with Dr. Marchelle Gearing by phone.  Relayed my concerns about the functionality of patient's insulin pump.  Do not recommend patient restart insulin pump once transitioned off IV insulin drip this time (see above for Lantus and Novolog recommendations).  Needs to follow up with his endocrinologist immediately after d/c.  Will follow. Ambrose Finland RN, MSN, CDE Diabetes Coordinator Inpatient Diabetes Program [phone]

## 2012-03-03 NOTE — Progress Notes (Addendum)
ANTICOAGULATION CONSULT NOTE - Follow Up Consult  Pharmacy Consult for heparin Indication: chest pain/ACS  No Known Allergies  Patient Measurements: Height: 5\' 7"  (170.2 cm) Weight: 151 lb 7.3 oz (68.7 kg) IBW/kg (Calculated) : 66.1   Vital Signs: Temp: 98.3 F (36.8 C) (08/14 1213) Temp src: Oral (08/14 1213) BP: 105/78 mmHg (08/14 1300) Pulse Rate: 94  (08/14 1300)  Labs:  Basename 03/03/12 1304 03/03/12 0936 03/03/12 0930 03/03/12 0450 03/02/12 1628 03/02/12 0930 03/01/12 2015 03/01/12 1010  HGB -- -- -- 12.1* -- -- 13.8 --  HCT -- -- -- 33.6* -- -- 39.3 49.8  PLT -- -- -- 148* -- -- 189 309  APTT -- -- -- -- -- -- -- --  LABPROT -- -- -- -- -- -- -- --  INR -- -- -- -- -- -- -- --  HEPARINUNFRC 0.39 -- -- 0.10* <0.10* -- -- --  CREATININE 0.59 -- 0.63 0.62 -- -- -- --  CKTOTAL -- -- -- -- 81 113 -- --  CKMB -- -- -- -- 9.4* 13.3* -- --  TROPONINI -- 5.06* -- -- 3.66* 4.76*4.76* -- --    Estimated Creatinine Clearance: 94.1 ml/min (by C-G formula based on Cr of 0.59).    Assessment: 56 yo M admitted 8/12 in DKA w 3-4 days of increased N/V/D and malaise. Pharmacy dosing heparin for elevated cardiac markers. HL in therapeutic range at 0.39 on 1500 units/hr. H/H 12.1/33.9, plts 148 (all down, ? dilutional). No bleeding noted, no infusion line issues per RN.   Goal of Therapy:  Heparin level 0.3-0.7 units/ml Monitor platelets by anticoagulation protocol: Yes   Plan:  - Continue heparin gtt at 1500 units/hr - 6 hour HL to confirm - F/u daily HL, CBC, s/sx bleeding   Thank you for the consult.   Tomi Bamberger, PharmD Clinical Pharmacist Pager: [phone] Pharmacy: [phone] 03/03/2012 2:28 PM

## 2012-03-03 NOTE — Progress Notes (Signed)
 SUBJECTIVE: He continues to have some sternal chest pain described as aching sensation which is worse with coughing and deep breath.   Filed Vitals:   03/03/12 0700 03/03/12 0800 03/03/12 0831 03/03/12 0900  BP: 126/69 132/79  114/73  Pulse: 90 90  91  Temp:   98.7 F (37.1 C)   TempSrc:   Oral   Resp: 14 17  17  Height:      Weight:      SpO2: 96% 97%  97%    Intake/Output Summary (Last 24 hours) at 03/03/12 1031 Last data filed at 03/03/12 0831  Gross per 24 hour  Intake 6590.7 ml  Output   4125 ml  Net 2465.7 ml    LABS: Basic Metabolic Panel:  Basename 03/03/12 0450 03/03/12 0047 03/02/12 0930  NA 136 135 --  K 3.5 3.1* --  CL 107 105 --  CO2 21 20 --  GLUCOSE 135* 150* --  BUN 6 6 --  CREATININE 0.62 0.71 --  CALCIUM 7.8* 7.7* --  MG 1.7 -- 1.9  PHOS 1.2* -- 0.6*   Liver Function Tests:  Basename 03/01/12 1010  AST 14  ALT 18  ALKPHOS 112  BILITOT 0.5  PROT 8.7*  ALBUMIN 5.5*    Basename 03/03/12 0450 03/02/12 0213  LIPASE 63* 223*  AMYLASE -- --   CBC:  Basename 03/03/12 0450 03/01/12 2015 03/01/12 1010  WBC 9.6 17.3* --  NEUTROABS -- -- 17.3*  HGB 12.1* 13.8 --  HCT 33.6* 39.3 --  MCV 87.5 91.0 --  PLT 148* 189 --   Cardiac Enzymes:  Basename 03/02/12 1628 03/02/12 0930 03/02/12 0213  CKTOTAL 81 113 --  CKMB 9.4* 13.3* --  CKMBINDEX -- -- --  TROPONINI 3.66* 4.76*4.76* 4.20*   BNP: No components found with this basename: POCBNP:3 D-Dimer: No results found for this basename: DDIMER:2 in the last 72 hours Hemoglobin A1C: No results found for this basename: HGBA1C in the last 72 hours Fasting Lipid Panel:  Basename 03/03/12 0500  CHOL 108  HDL 54  LDLCALC 43  TRIG 55  CHOLHDL 2.0  LDLDIRECT --   Thyroid Function Tests: No results found for this basename: TSH,T4TOTAL,FREET3,T3FREE,THYROIDAB in the last 72 hours Anemia Panel: No results found for this basename: VITAMINB12,FOLATE,FERRITIN,TIBC,IRON,RETICCTPCT in the last  72 hours   PHYSICAL EXAM General: Well developed, well nourished, in no acute distress HEENT:  Normocephalic and atramatic Neck:  No JVD.  Lungs: Clear bilaterally to auscultation and percussion. Heart: HRRR . Normal S1 and S2 without gallops or murmurs.  Abdomen: Bowel sounds are positive, abdomen soft and non-tender  Msk:  Back normal, normal gait. Normal strength and tone for age. Extremities: No clubbing, cyanosis or edema.   Neuro: Alert and oriented X 3. Psych:  Good affect, responds appropriately  TELEMETRY: Reviewed telemetry pt in normal sinus rhythm:  ASSESSMENT AND PLAN: 1. Diabetic ketoacidosis with h/o IDDM - current course being managed by PCCM, including lytes/fluids. Still on small dose insulin drip which will be discontinued today. 2. NSTEMI with possible myopericarditis -his chest pain seems to be pleuritic in nature. However, echocardiogram showed low normal LV systolic function with mild hypokinesis in the distal septal and apical wall. The patient reports having CTA of the coronary arteries in [place] which showed plaque in the LAD without significant stenosis few years ago. Thus, I feel that underlying obstructive coronary artery disease will need to be excluded especially with his history of diabetes. I recommend proceeding with   cardiac catheterization and possible coronary intervention. Risks, benefits and alternatives were discussed with the patient. We'll plan on doing the procedure tomorrow. Continue heparin for now. 3. Elevated lipase - primary team doubts pancreatitis. Trending down.  4. Moderate EtOH use (2-4 drinks daily) - consider CIWA protocol initiation.   Earnie Rockhold, MD, FACC 03/03/2012 10:31 AM     

## 2012-03-03 NOTE — Progress Notes (Addendum)
Per RN, Jordan Waller, patient resumed insulin pump at ~10:30 this morning.  Insulin drip can be d/c'd at 11:30.  Insulin pump patient contract signed with patient and given to RN to place in patient's shadow chart.  Insulin pump flow sheet given to patient and wife and both were instructed on how to use it.  Patient is A&O x3 and able to independently manage insulin pump.  Per patient, he will be having a cardiac cath tomorrow.  Instructed patient and wife to disconnect insulin pump right before cardiac cath.  Pump should not be worn in room where patient is getting radiation during xrays taken during cardiac cath.  Patient instructed to immediately reconnect insulin pump right after procedure.  Encouraged patient to wear the insulin pump down to procedure and disconnect right before the procedure starts and then reconnect right after the cardiac cath is done.    Reminded RN to document all insulin boluses, grams of carbs consumed by patient, and when set/site changed.  Flow sheet completed for this shift.  Basal rates on insulin pump are as follows: 12am- 0.9 units/hr 8am- 1.1 units/hr  Patient gets a total of 24.8 units total basal insulin per 24 hour period.  Carbohydrate ratio is 1 unit for every 9 grams carbs consumed  Correction factor is 1 unit for every 34 mg/dl above target CBG of 161 mg/dl  Will follow. Jordan Finland RN, MSN, CDE Diabetes Coordinator Inpatient Diabetes Program [phone]

## 2012-03-03 NOTE — Progress Notes (Signed)
ANTICOAGULATION CONSULT NOTE - Follow Up Consult  Pharmacy Consult heparin  Indication: chest pain/ACS  No Known Allergies  Patient Measurements: Height: 5\' 7"  (170.2 cm) Weight: 151 lb 7.3 oz (68.7 kg) IBW/kg (Calculated) : 66.1  Heparin Dosing Weight:   Vital Signs: Temp: 99.8 F (37.7 C) (08/14 0434) Temp src: Oral (08/14 0434) BP: 107/64 mmHg (08/14 0500) Pulse Rate: 90  (08/14 0400)  Labs:  Basename 03/03/12 0450 03/03/12 0047 03/02/12 2112 03/02/12 1628 03/02/12 0930 03/02/12 0213 03/01/12 2015 03/01/12 1010  HGB 12.1* -- -- -- -- -- 13.8 --  HCT 33.6* -- -- -- -- -- 39.3 49.8  PLT 148* -- -- -- -- -- 189 309  APTT -- -- -- -- -- -- -- --  LABPROT -- -- -- -- -- -- -- --  INR -- -- -- -- -- -- -- --  HEPARINUNFRC 0.10* -- -- <0.10* 0.10* -- -- --  CREATININE 0.62 0.71 0.71 -- -- -- -- --  CKTOTAL -- -- -- 81 113 -- -- --  CKMB -- -- -- 9.4* 13.3* -- -- --  TROPONINI -- -- -- 3.66* 4.76*4.76* 4.20* -- --    Estimated Creatinine Clearance: 94.1 ml/min (by C-G formula based on Cr of 0.62).  Assessment: Heparin level subtherapeutic despite increase to 1200 units/hr no bleeding reported Goal of Therapy:  Heparin level 0.3-0.7 units/ml    Plan:  rebolus 2000 units and increase to 1500 units/hr with 6 hr follow up.  Janice Coffin 03/03/2012,5:49 AM

## 2012-03-03 NOTE — Progress Notes (Signed)
I was asked to evaluate the patient because of an increasing troponin value. The chart was reviewed in detail. The patient has had 2 days of chest pain that continues. His pain has predominantly been pleuritic in nature. His cardiac enzymes have been elevated with a relatively flat trend. However his troponin has increased from yesterday afternoon to today with a previous value of 3.7 and today's value of 5.06. However, previous troponins were in the range of 4.76 and 4.20. Clinically the patient is unchanged. I reviewed his EKG and I think his overall clinical picture is consistent with myopericarditis. We will plan on cardiac catheterization tomorrow to exclude acute coronary syndrome and obstructive CAD. The patient understands the plan and agrees.  Jordan Waller 03/03/2012 3:04 PM

## 2012-03-03 NOTE — Progress Notes (Signed)
Pt self administered 4.125 units of insulin per pt's insulin pump.

## 2012-03-03 NOTE — Progress Notes (Signed)
ANTICOAGULATION CONSULT NOTE - Follow Up Consult  Pharmacy Consult for heparin Indication: chest pain/ACS  No Known Allergies  Patient Measurements: Height: 5\' 7"  (170.2 cm) Weight: 151 lb 7.3 oz (68.7 kg) IBW/kg (Calculated) : 66.1   Vital Signs: Temp: 98.3 F (36.8 C) (08/14 1213) Temp src: Oral (08/14 1213) BP: 113/76 mmHg (08/14 1900) Pulse Rate: 89  (08/14 1900)  Labs:  Basename 03/03/12 2016 03/03/12 1856 03/03/12 1650 03/03/12 1500 03/03/12 1456 03/03/12 1304 03/03/12 0936 03/03/12 0450 03/02/12 1628 03/02/12 0930 03/01/12 2015  HGB -- -- -- 13.3 -- -- -- 12.1* -- -- --  HCT -- -- -- 36.8* -- -- -- 33.6* -- -- 39.3  PLT -- -- -- 123* -- -- -- 148* -- -- 189  APTT -- -- -- -- -- -- -- -- -- -- --  LABPROT -- -- -- -- -- -- -- -- -- -- --  INR -- -- -- -- -- -- -- -- -- -- --  HEPARINUNFRC 0.37 -- -- -- -- 0.39 -- 0.10* -- -- --  CREATININE -- 0.66 0.70 0.72 -- -- -- -- -- -- --  CKTOTAL -- -- -- -- 91 -- -- -- 81 113 --  CKMB -- -- -- -- 6.3* -- -- -- 9.4* 13.3* --  TROPONINI -- -- -- -- 1.91* -- 5.06* -- 3.66* -- --    Estimated Creatinine Clearance: 94.1 ml/min (by C-G formula based on Cr of 0.66).    Assessment: 56 yo M admitted 8/12 in DKA w 3-4 days of increased N/V/D and malaise. Pharmacy dosing heparin for elevated cardiac markers. HL in therapeutic range at 0.37 on 1500 units/hr. H/H 12.1/33.9, plts 148 (all down, ?dilutional). No bleeding noted.   Goal of Therapy:  Heparin level 0.3-0.7 units/ml Monitor platelets by anticoagulation protocol: Yes   Plan:  - Continue heparin gtt at 1500 units/hr - F/u daily HL, CBC, s/sx bleeding   Thank you for the consult.   Link Snuffer, PharmD, BCPS Clinical Pharmacist [phone] 03/03/2012 9:01 PM

## 2012-03-04 ENCOUNTER — Encounter (HOSPITAL_COMMUNITY): Payer: Self-pay | Admitting: Internal Medicine

## 2012-03-04 ENCOUNTER — Encounter (HOSPITAL_COMMUNITY)
Admission: EM | Disposition: A | Discharge status: Home / Self Care | Payer: Self-pay | Source: Home / Self Care | Attending: Pulmonary Disease

## 2012-03-04 DIAGNOSIS — I214 Non-ST elevation (NSTEMI) myocardial infarction: Secondary | ICD-10-CM

## 2012-03-04 DIAGNOSIS — I2510 Atherosclerotic heart disease of native coronary artery without angina pectoris: Secondary | ICD-10-CM

## 2012-03-04 HISTORY — PX: LEFT HEART CATHETERIZATION WITH CORONARY ANGIOGRAM: SHX5451

## 2012-03-04 LAB — GLUCOSE, CAPILLARY
Glucose-Capillary: 347 mg/dL — ABNORMAL HIGH (ref 70–99)
Glucose-Capillary: 316 mg/dL — ABNORMAL HIGH (ref 70–99)
Glucose-Capillary: 120 mg/dL — ABNORMAL HIGH (ref 70–99)
Glucose-Capillary: 140 mg/dL — ABNORMAL HIGH (ref 70–99)
Glucose-Capillary: 132 mg/dL — ABNORMAL HIGH (ref 70–99)

## 2012-03-04 LAB — BASIC METABOLIC PANEL
BUN: 5 mg/dL — ABNORMAL LOW (ref 6–23)
BUN: 7 mg/dL (ref 6–23)
Calcium: 7.9 mg/dL — ABNORMAL LOW (ref 8.4–10.5)
Calcium: 8.2 mg/dL — ABNORMAL LOW (ref 8.4–10.5)
Calcium: 8.9 mg/dL (ref 8.4–10.5)
Chloride: 98 mEq/L (ref 96–112)
Creatinine, Ser: 0.56 mg/dL (ref 0.50–1.35)
Creatinine, Ser: 0.56 mg/dL (ref 0.50–1.35)
GFR calc Af Amer: >90 mL/min (ref >90)
GFR calc Af Amer: >90 mL/min (ref >90)
GFR calc non Af Amer: >90 mL/min (ref >90)
GFR calc non Af Amer: >90 mL/min (ref >90)
Glucose, Bld: 140 mg/dL — ABNORMAL HIGH (ref 70–99)
Glucose, Bld: 229 mg/dL — ABNORMAL HIGH (ref 70–99)
Potassium: 3.4 mEq/L — ABNORMAL LOW (ref 3.5–5.1)
Sodium: 136 mEq/L (ref 135–145)

## 2012-03-04 LAB — CBC
MCH: 31.8 pg (ref 26.0–34.0)
MCV: 87.4 fL (ref 78.0–100.0)
Platelets: 159 10*3/uL (ref 150–400)
RDW: 12.6 % (ref 11.5–15.5)

## 2012-03-04 LAB — PHOSPHORUS: Phosphorus: 1.6 mg/dL — ABNORMAL LOW (ref 2.3–4.6)

## 2012-03-04 LAB — PROTIME-INR
INR: 1.76 — ABNORMAL HIGH (ref 0.00–1.49)
Prothrombin Time: 20.8 seconds — ABNORMAL HIGH (ref 11.6–15.2)

## 2012-03-04 SURGERY — LEFT HEART CATHETERIZATION WITH CORONARY ANGIOGRAM
Anesthesia: LOCAL

## 2012-03-04 MED ORDER — INSULIN ASPART 100 UNIT/ML ~~LOC~~ SOLN
2.0000 [IU] | SUBCUTANEOUS | Status: DC
  Administered 2012-03-04 (×3): 2 [IU] via SUBCUTANEOUS

## 2012-03-04 MED ORDER — PRASUGREL HCL 10 MG PO TABS
10.0000 mg | ORAL_TABLET | Freq: Every day | ORAL | Status: DC
  Administered 2012-03-05 – 2012-03-06 (×2): 10 mg via ORAL
  Filled 2012-03-04 (×2): qty 1

## 2012-03-04 MED ORDER — ZOLPIDEM TARTRATE 5 MG PO TABS
5.0000 mg | ORAL_TABLET | Freq: Every evening | ORAL | Status: DC | PRN
  Administered 2012-03-05: 5 mg via ORAL
  Filled 2012-03-04 (×2): qty 1

## 2012-03-04 MED ORDER — POTASSIUM CHLORIDE CRYS ER 10 MEQ PO TBCR
30.0000 meq | EXTENDED_RELEASE_TABLET | ORAL | Status: AC
  Administered 2012-03-04 (×2): 30 meq via ORAL
  Filled 2012-03-04: qty 1
  Filled 2012-03-04: qty 3

## 2012-03-04 MED ORDER — FENTANYL CITRATE 0.05 MG/ML IJ SOLN
INTRAMUSCULAR | Status: AC
  Filled 2012-03-04: qty 2

## 2012-03-04 MED ORDER — INSULIN ASPART 100 UNIT/ML ~~LOC~~ SOLN
0.0000 [IU] | Freq: Every day | SUBCUTANEOUS | Status: DC
  Administered 2012-03-04: 3 [IU] via SUBCUTANEOUS

## 2012-03-04 MED ORDER — POTASSIUM CHLORIDE 10 MEQ/100ML IV SOLN: 10.0000 meq | INTRAVENOUS | Status: DC

## 2012-03-04 MED ORDER — NITROGLYCERIN 0.2 MG/ML ON CALL CATH LAB
INTRAVENOUS | Status: AC
  Filled 2012-03-04: qty 1

## 2012-03-04 MED ORDER — SODIUM PHOSPHATE 3 MMOLE/ML IV SOLN
30.0000 mmol | Freq: Once | INTRAVENOUS | Status: DC
  Filled 2012-03-04: qty 10

## 2012-03-04 MED ORDER — HEPARIN (PORCINE) IN NACL 2-0.9 UNIT/ML-% IJ SOLN
INTRAMUSCULAR | Status: AC
  Filled 2012-03-04: qty 2000

## 2012-03-04 MED ORDER — INSULIN GLARGINE 100 UNIT/ML ~~LOC~~ SOLN
14.0000 [IU] | SUBCUTANEOUS | Status: DC
  Administered 2012-03-04: 14 [IU] via SUBCUTANEOUS

## 2012-03-04 MED ORDER — BIVALIRUDIN 250 MG IV SOLR
INTRAVENOUS | Status: AC
  Filled 2012-03-04: qty 250

## 2012-03-04 MED ORDER — SODIUM CHLORIDE 0.9 % IJ SOLN
3.0000 mL | Freq: Two times a day (BID) | INTRAMUSCULAR | Status: DC
  Administered 2012-03-04 – 2012-03-05 (×4): 3 mL via INTRAVENOUS

## 2012-03-04 MED ORDER — POTASSIUM PHOSPHATE DIBASIC 3 MMOLE/ML IV SOLN
24.0000 mmol | Freq: Once | INTRAVENOUS | Status: AC
  Administered 2012-03-04: 24 mmol via INTRAVENOUS
  Filled 2012-03-04: qty 8

## 2012-03-04 MED ORDER — SODIUM CHLORIDE 0.9 % IJ SOLN: 3.0000 mL | INTRAMUSCULAR | Status: DC | PRN

## 2012-03-04 MED ORDER — INSULIN ASPART 100 UNIT/ML ~~LOC~~ SOLN: 0.0000 [IU] | Freq: Three times a day (TID) | SUBCUTANEOUS | Status: DC

## 2012-03-04 MED ORDER — MIDAZOLAM HCL 2 MG/2ML IJ SOLN
INTRAMUSCULAR | Status: AC
  Filled 2012-03-04: qty 2

## 2012-03-04 MED ORDER — METOPROLOL TARTRATE 25 MG PO TABS
25.0000 mg | ORAL_TABLET | Freq: Two times a day (BID) | ORAL | Status: DC
  Administered 2012-03-04 – 2012-03-05 (×4): 25 mg via ORAL
  Filled 2012-03-04 (×8): qty 1

## 2012-03-04 MED ORDER — ASPIRIN 81 MG PO CHEW
324.0000 mg | CHEWABLE_TABLET | ORAL | Status: AC
  Administered 2012-03-04: 243 mg via ORAL
  Filled 2012-03-04: qty 3

## 2012-03-04 MED ORDER — LIDOCAINE HCL (PF) 1 % IJ SOLN
INTRAMUSCULAR | Status: AC
  Filled 2012-03-04: qty 30

## 2012-03-04 MED ORDER — ACETAMINOPHEN 325 MG PO TABS
650.0000 mg | ORAL_TABLET | ORAL | Status: DC | PRN
  Administered 2012-03-05: 650 mg via ORAL
  Filled 2012-03-04: qty 1
  Filled 2012-03-04 (×2): qty 2

## 2012-03-04 MED ORDER — PRASUGREL HCL 10 MG PO TABS
ORAL_TABLET | ORAL | Status: AC
  Filled 2012-03-04: qty 6

## 2012-03-04 MED ORDER — INSULIN ASPART 100 UNIT/ML ~~LOC~~ SOLN
0.0000 [IU] | SUBCUTANEOUS | Status: DC
  Administered 2012-03-04: 4 [IU] via SUBCUTANEOUS
  Administered 2012-03-04: 3 [IU] via SUBCUTANEOUS
  Administered 2012-03-04: 1 [IU] via SUBCUTANEOUS

## 2012-03-04 MED ORDER — DEXTROSE-NACL 5-0.45 % IV SOLN: INTRAVENOUS | Status: DC

## 2012-03-04 MED ORDER — INSULIN GLARGINE 100 UNIT/ML ~~LOC~~ SOLN
15.0000 [IU] | Freq: Every day | SUBCUTANEOUS | Status: DC
  Administered 2012-03-04 – 2012-03-05 (×2): 15 [IU] via SUBCUTANEOUS

## 2012-03-04 MED ORDER — ONDANSETRON HCL 4 MG/2ML IJ SOLN: 4.0000 mg | Freq: Four times a day (QID) | INTRAMUSCULAR | Status: DC | PRN

## 2012-03-04 MED ORDER — SODIUM CHLORIDE 0.9 % IV SOLN: 250.0000 mL | INTRAVENOUS | Status: DC | PRN

## 2012-03-04 MED ORDER — MAGNESIUM SULFATE 40 MG/ML IJ SOLN
2.0000 g | Freq: Once | INTRAMUSCULAR | Status: AC
  Administered 2012-03-04: 2 g via INTRAVENOUS
  Filled 2012-03-04: qty 50

## 2012-03-04 MED ORDER — ATORVASTATIN CALCIUM 40 MG PO TABS
40.0000 mg | ORAL_TABLET | Freq: Every day | ORAL | Status: DC
  Administered 2012-03-04 – 2012-03-06 (×3): 40 mg via ORAL
  Filled 2012-03-04 (×3): qty 1

## 2012-03-04 MED ORDER — SODIUM CHLORIDE 0.9 % IV SOLN
INTRAVENOUS | Status: DC
  Administered 2012-03-04: 10 mL/h via INTRAVENOUS

## 2012-03-04 NOTE — Progress Notes (Signed)
[facility] ADULT ICU REPLACEMENT PROTOCOL FOR AM LAB REPLACEMENT ONLY  The patient does apply for the [facility] Adult ICU Electrolyte Replacment Protocol based on the criteria listed below:   1. Is GFR >/= 50 ml/min? yes  Patient's GFR today is >90 2. Is urine output >/= 0.5 ml/kg/hr for the last 8 hours? yes Patient's UOP is 3.8 ml/kg/hr 3. Is BUN < 30 mg/dL? yes  Patient's BUN today is 5 4. Abnormal electrolyte(s):K (3.2),phos (1.6), mag (1.8) 5. Ordered repletion with: K 42meg,phos 30 mmol, mg 2gm 6. If a panic level lab has been reported, has the CCM MD in charge been notified? yes.   Physician:  Dr Brion Aliment, Charlynn Grimes 03/04/2012 5:26 AM

## 2012-03-04 NOTE — H&P (View-Only) (Signed)
SUBJECTIVE: He continues to have some sternal chest pain described as aching sensation which is worse with coughing and deep breath.   Filed Vitals:   03/03/12 0700 03/03/12 0800 03/03/12 0831 03/03/12 0900  BP: 126/69 132/79  114/73  Pulse: 90 90  91  Temp:   98.7 F (37.1 C)   TempSrc:   Oral   Resp: 14 17  17   Height:      Weight:      SpO2: 96% 97%  97%    Intake/Output Summary (Last 24 hours) at 03/03/12 1031 Last data filed at 03/03/12 0831  Gross per 24 hour  Intake 6590.7 ml  Output   4125 ml  Net 2465.7 ml    LABS: Basic Metabolic Panel:  Basename 03/03/12 0450 03/03/12 0047 03/02/12 0930  NA 136 135 --  K 3.5 3.1* --  CL 107 105 --  CO2 21 20 --  GLUCOSE 135* 150* --  BUN 6 6 --  CREATININE 0.62 0.71 --  CALCIUM 7.8* 7.7* --  MG 1.7 -- 1.9  PHOS 1.2* -- 0.6*   Liver Function Tests:  Basename 03/01/12 1010  AST 14  ALT 18  ALKPHOS 112  BILITOT 0.5  PROT 8.7*  ALBUMIN 5.5*    Basename 03/03/12 0450 03/02/12 0213  LIPASE 63* 223*  AMYLASE -- --   CBC:  Basename 03/03/12 0450 03/01/12 2015 03/01/12 1010  WBC 9.6 17.3* --  NEUTROABS -- -- 17.3*  HGB 12.1* 13.8 --  HCT 33.6* 39.3 --  MCV 87.5 91.0 --  PLT 148* 189 --   Cardiac Enzymes:  Basename 03/02/12 1628 03/02/12 0930 03/02/12 0213  CKTOTAL 81 113 --  CKMB 9.4* 13.3* --  CKMBINDEX -- -- --  TROPONINI 3.66* 4.76*4.76* 4.20*   BNP: No components found with this basename: POCBNP:3 D-Dimer: No results found for this basename: DDIMER:2 in the last 72 hours Hemoglobin A1C: No results found for this basename: HGBA1C in the last 72 hours Fasting Lipid Panel:  Basename 03/03/12 0500  CHOL 108  HDL 54  LDLCALC 43  TRIG 55  CHOLHDL 2.0  LDLDIRECT --   Thyroid Function Tests: No results found for this basename: TSH,T4TOTAL,FREET3,T3FREE,THYROIDAB in the last 72 hours Anemia Panel: No results found for this basename: VITAMINB12,FOLATE,FERRITIN,TIBC,IRON,RETICCTPCT in the last  72 hours   PHYSICAL EXAM General: Well developed, well nourished, in no acute distress HEENT:  Normocephalic and atramatic Neck:  No JVD.  Lungs: Clear bilaterally to auscultation and percussion. Heart: HRRR . Normal S1 and S2 without gallops or murmurs.  Abdomen: Bowel sounds are positive, abdomen soft and non-tender  Msk:  Back normal, normal gait. Normal strength and tone for age. Extremities: No clubbing, cyanosis or edema.   Neuro: Alert and oriented X 3. Psych:  Good affect, responds appropriately  TELEMETRY: Reviewed telemetry pt in normal sinus rhythm:  ASSESSMENT AND PLAN: 1. Diabetic ketoacidosis with h/o IDDM - current course being managed by PCCM, including lytes/fluids. Still on small dose insulin drip which will be discontinued today. 2. NSTEMI with possible myopericarditis -his chest pain seems to be pleuritic in nature. However, echocardiogram showed low normal LV systolic function with mild hypokinesis in the distal septal and apical wall. The patient reports having CTA of the coronary arteries in [place] which showed plaque in the LAD without significant stenosis few years ago. Thus, I feel that underlying obstructive coronary artery disease will need to be excluded especially with his history of diabetes. I recommend proceeding with  cardiac catheterization and possible coronary intervention. Risks, benefits and alternatives were discussed with the patient. We'll plan on doing the procedure tomorrow. Continue heparin for now. 3. Elevated lipase - primary team doubts pancreatitis. Trending down.  4. Moderate EtOH use (2-4 drinks daily) - consider CIWA protocol initiation.   Lorine Bears, MD, [facility] 03/03/2012 10:31 AM

## 2012-03-04 NOTE — CV Procedure (Signed)
Cardiac Catheterization Operative Report  Jordan Waller [id number] 8/15/20138:20 AM No primary provider on file.  Procedure Performed:  1. Left Heart Catheterization 2. Selective Coronary Angiography 3. Left ventricular angiogram 4. PTCA/DES x 1 mid LAD 5. Angioseal femoral artery closure device RFA  Operator: Verne Carrow, MD  Indication: 56 yo male with h/o DM admitted with DKA and found to have NSTEMI.                                   Procedure Details: The risks, benefits, complications, treatment options, and expected outcomes were discussed with the patient. The patient and/or family concurred with the proposed plan, giving informed consent. The patient was brought to the cath lab after IV hydration was begun and oral premedication was given. The patient was further sedated with Versed and Fentanyl. The right groin was prepped and draped in the usual manner. Using the modified Seldinger access technique, a 5 French sheath was placed in the right femoral artery. Standard diagnostic catheters were used to perform selective coronary angiography. A pigtail catheter was used to perform a left ventricular angiogram. I then elected to perform angioplasty of the mid LAD.   The sheath was changed out for a 6 [place] system. He was given Effient 60 mg po x 1. He was given a bolus of Angiomax and a drip was started. I then engaged the left main with a XB LAD 3.5 guiding catheter. When the ACT was greater than 200, I passed a Cougar IC wire down the LAD. A 2.5 x 12 mm balloon was used to pre-dilate the stenosis. I then carefully positioned and deployed a 3.0 x 16 mm Promus Element DES in the mid LAD. This was post-dilated with a 3.25 x 12 mm [place] balloon. There was an excellent angiographic result. There was mild plaque distal to the stent but no obvious dissection or edge tear. There was excellent flow into the distal vessel. The wire and guide were removed. An angioseal femoral artery  closure device was placed in the right femoral artery.   There were no immediate complications. The patient was taken to the recovery area in stable condition.   Hemodynamic Findings: Central aortic pressure: 94/54 Left ventricular pressure: 102/2/5  Angiographic Findings:  Left main: No disease.   Left Anterior Descending Artery: Large caliber vessel that courses to the apex. The ostium has 25% stenosis. The proximal vessel has luminal irregularities. The mid vessel has a 99% stenosis. The distal vessel has no obstructive disease. There is a moderate sized diagonal branch with no disease noted.   Circumflex Artery: Moderate sized vessel with moderate sized first obtuse marginal branch. The AV groove Circumflex is moderate sized beyond the OM branch. No disease noted.   Right Coronary Artery: Large, dominant vessel with mild luminal irregularities in the proximal/mid and distal vessel. The PDA has 20% ostial stenosis.   Left Ventricular Angiogram: LVEF 40-45% with hypokinesis of the anterior wall and apex.   Impression: 1. Single vessel CAD, now s/p successful PTCA/DES x 1 mid LAD 2. Segmental LV systolic dysfunction 3. NSTEMI  Recommendations: He will need ASA and Effient for at least one year. Will start a statin. He will also need to be started on a beta blocker.        Complications:  None. The patient tolerated the procedure well.

## 2012-03-04 NOTE — Interval H&P Note (Signed)
History and Physical Interval Note:  03/04/2012 7:35 AM  Jordan Waller  has presented today for cardiac cath with level troponin elevation, diffuse ST changes c/w myopericarditis, admitted with DKA  with the diagnosis of cp  The various methods of treatment have been discussed with the patient and family. After consideration of risks, benefits and other options for treatment, the patient has consented to  Procedure(s) (LRB): LEFT HEART CATHETERIZATION WITH CORONARY ANGIOGRAM (N/A) as a surgical intervention .  The patient's history has been reviewed, patient examined, no change in status, stable for surgery.  I have reviewed the patient's chart and labs.  Questions were answered to the patient's satisfaction.     [name]

## 2012-03-04 NOTE — Progress Notes (Addendum)
Noted Dr. Sharl Ma (patient's endocrinologist) consulted today.  Saw patient this afternoon.  Noted Dr. Daune Perch plan to send patient home on SQ injections of Lantus and Novolog until he can be seen by Dr. Sharl Ma and Cristy Folks immediately after d/c to make insulin pump adjustments.  Spoke with patient about this plan.  Patient told me he used insulin pens (Lantus and Novolog) before he started the insulin pump and he is ok doing that after d/c until he sees Dr. Sharl Ma.  Will revisit patient tomorrow and assess he if has any additional needs.  Noted RD note that stated patient's wife wants patient to have additional insulin pump training while here in hospital.  There are no insulin pump trainers available here in hospital and patient will need to get additional pump training with Dr. Daune Perch office.  Currently patient on Lantus 14 units daily along with Novolog 2 units Q4 hours plus ICU correction Novolog Q4 hours.  While on pump, patient was getting ~24 units total basal insulin.    Recommend the following changes: 1. Switch patient to the Moderate correction scale (SSi) tid ac + HS per the Glycemic control order set 2. D/C scheduled Novolog 2 units Q4 hours  3. Start Novolog 4 units tid with meals for now (meal coverage) 4. May need more basal insulin as well- Check fasting sugar tomorrow to assess basal needs  Will follow. Ambrose Finland RN, MSN, CDE Diabetes Coordinator Inpatient Diabetes Program [phone]

## 2012-03-04 NOTE — Progress Notes (Signed)
Brief Nutrition Note  Diet office contacted RD regarding patient's wife being unhappy with meal options.  RD discussed philosophy around CHO-modified diets while in the hospital; patient is allowed 60-75 grams of CHO with each meal.  Wife has ordered lunch meal and nutritional services ambassador noted specific preferences for patient.  Nutritional services will honor patient/wife's preferences.  Discussed the importance of CHO-counting and having adequate protein with all meals.  Wife and patient were appreciative of visit, but they did not want a lot of diet teaching, since they've already had teaching.  They request teaching on insulin pump before leaving the hospital.  Diabetes Coordinator is following patient.  No further nutrition needs at this time.  Joaquin Courts, RD, CNSC, LDN Pager# [phone] After Hours Pager# [phone]

## 2012-03-04 NOTE — Progress Notes (Addendum)
Name: Jordan Waller MRN: [id number] DOB: 12/09/1955    LOS: 3  Referring Provider:  ED / [facility] Reason for Referral:  DKA   PULMONARY / CRITICAL CARE MEDICINE  HPI:  56 y/o M with hx of DM on insulin pump, hx of plaque seen on co arteries few years ago in [place] NOS, daily 2-4 drink etoh user, HLD presented to [facility]12 with a 3-4 day hx of vomiting, diarrhea, generalized pain, HA and elevated blood sugars.  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  Also patient reports significant drinking at conference.  Work up demonstrated ph 7.0 / pCO2 13 / HCO3 3.5, K 5.6, creatinine 1.20, lipase 810, lactate 1.8 and moderate acetone in blood.  Rx's with NS, IV insulin and Tx to [facility] for further care.      Brief patient description:  56 y/o M with DKA in setting of nausea / vomiting / diarrhea.  Events Since Admission: 8/12 - Admit with DKA 8/13 -  Remains unintubated. Gap closed. Bicarb improving but at 20. Wife at bedside concerned about volume overload state. TRopnins positive and cards consulted. On heparin 8/14  -replased DKA after transitioned to his insulin pump   SUBJECTIVE/OVERNIGHT/INTERVAL HX  8/15: Again gap closed and off DKA protocol . Now transitioned to ssi with lantus and scheduled novolog and no DKA relapse. Had cath - s.p stent LAD. No chest pain.  Private conversation with wife:  Wife very concerned about his life style - travels a lot, stress, coping mechanisim with daily etoh (rated as 2 drinks minimum each night and 4 drink 4 nights per week). Wife upset that he will not take zoloft for anxiety. Patient will not listen to wife     Vital Signs: Temp:  [97.8 F (36.6 C)-98.6 F (37 C)] 97.8 F (36.6 C) (08/15 1150) Pulse Rate:  [73-97] 78  (08/15 1400) Resp:  [11-20] 13  (08/15 1400) BP: (106-136)/(63-84) 106/76 mmHg (08/15 1400) SpO2:  [96 %-99 %] 98 % (08/15  1400)  Physical Examination: General:  Looking beter Neuro:  AAOx4, speech clear, MAE HEENT:  Mm pink/moist, no jvd Cardiovascular:  s1s2 rrr, no m/r/g Lungs:  resp's even/non-labored, lungs bilaterally clear Abdomen:  Round / soft, bsx4 active Musculoskeletal:  No acute deformities Skin:  Red face, no breakdown  Active Problems:  DKA (diabetic ketoacidoses)  Metabolic acidosis  Nausea & vomiting  Diarrhea  NSTEMI (non-ST elevated myocardial infarction)  Acute myopericarditis  ASSESSMENT AND PLAN  PULMONARY  Lab 03/01/12 1234  PHART 7.021*  PCO2ART 13.4*  PO2ART 128.0*  HCO3 3.5*  O2SAT 97.0   CXR:  8/12>>>nad  A:   Maintaining airway. No acute issues  P:   Goal SpO2>92 Supplemental oxygen  CARDIOVASCULAR  Lab 03/04/12 0415 03/03/12 2214 03/03/12 1456 03/03/12 0936 03/02/12 1628 03/01/12 1045  TROPONINI 2.49* 3.97* 1.91* 5.06* 3.66* --  LATICACIDVEN -- -- -- -- -- 1.8  PROBNP -- -- -- -- -- --   ECG:  Pending>>> Lines:  ECHO 03/02/12: Distal septal and apical hypokinesis The cavity size was normal. Wall thickness was normal. Systolic function was normal. The estimated ejection fraction was in the range of 50% to 55%.  A:  NSTEMI - new. Per cards likely result of DKA stress plaque rupture  - on 03/03/12: ongoing constant chest pain since admit pleuritic nature but troponin this AM increased  - on 03/04/12 - s/p PTCA/DES  x 1 mid LAD   P:  Per Cards consult     RENAL  Lab 03/04/12 0906 03/04/12 0415 03/04/12 0055 03/03/12 2023 03/03/12 2016 03/03/12 0450 03/02/12 0930  NA 135 137 136 134* 139 -- --  K 3.2* 3.2* -- -- -- -- --  CL 98 102 103 103 105 -- --  CO2 27 24 25 23 25  -- --  BUN 5* 5* 6 8 8  -- --  CREATININE 0.56 0.56 0.57 0.61 0.68 -- --  CALCIUM 8.2* 8.2* 7.9* 7.8* 8.2* -- --  MG -- 1.8 -- -- -- 1.7 1.9  PHOS -- 1.6* -- -- -- 1.2* 0.6*   Intake/Output      08/14 0701 - 08/15 0700 08/15 0701 - 08/16 0700   P.O.     I.V. (mL/kg) 3199.3  (46.6) 195 (2.8)   IV Piggyback 956.7    Total Intake(mL/kg) 4156 (60.5) 195 (2.8)   Urine (mL/kg/hr) 9000 (5.5)    Total Output 9000    Net -4844 +195         Foley:  NA  A: DKA  With electrolyte issues at admit 03/01/2012. PEr wife and patient: poor baseline control esp last few weeks and recent conference stress +/- possible viral URI  - - 8/14: recurrent DKA when he went to his insulin pump.   - 8/15: gap closed  P:   Hold off his insulin pump Use ssi Dr Debara Pickett primary endocrinologist bedside consult  GASTROINTESTINAL  Lab 03/03/12 0930 03/01/12 1010  AST 22 14  ALT 10 18  ALKPHOS 55 112  BILITOT 0.8 0.5  PROT 5.2* 8.7*  ALBUMIN 2.8* 5.5*   Lipase 810  A: Nausea / Vomiting / Diarrhea.  History of recent ETOH intake / elevated lipase - rule out pancreatitis - however clinically benign abdomen.  - on 8/12: Lipase 810  - on 03/03/12 : lipase 63. Probably had mild pancreatitis, now resolved  P:   PRN zofran   HEMATOLOGIC  Lab 03/04/12 0906 03/04/12 0415 03/03/12 1500 03/03/12 0450 03/01/12 2015 03/01/12 1010  HGB -- 12.9* 13.3 12.1* 13.8 17.7*  HCT -- 35.5* 36.8* 33.6* 39.3 49.8  PLT -- 159 123* 148* 189 309  INR 1.76* -- -- -- -- --  APTT -- -- -- -- -- --   A:   Anemia of critical illness  P:  - PRBC for   -  if ACS susepcted/confirmed then transfuse for hgb </= 8.0gm%,  or    -    INFECTIOUS  Lab 03/04/12 0415 03/03/12 1500 03/03/12 0500 03/03/12 0450 03/01/12 2015 03/01/12 1010  WBC 7.7 8.9 -- 9.6 17.3* 20.6*  PROCALCITON -- -- <0.10 -- <0.10 --   Cultures: 8/12 UA>>> 8/12 BCx2>>> 8/12 PCT>>>  Results for orders placed during the hospital encounter of 03/01/12  MRSA PCR SCREENING     Status: Normal   Collection Time   03/01/12  5:30 PM      Component Value Range Status Comment   MRSA by PCR NEGATIVE  NEGATIVE Final   CULTURE, BLOOD (ROUTINE X 2)     Status: Normal (Preliminary result)   Collection Time   03/01/12  8:03 PM      Component  Value Range Status Comment   Specimen Description BLOOD RIGHT ARM   Final    Special Requests BOTTLES DRAWN AEROBIC AND ANAEROBIC 10CC EACH   Final    Culture  Setup Time 03/02/2012 01:51   Final  Culture     Final    Value:        BLOOD CULTURE RECEIVED NO GROWTH TO DATE CULTURE WILL BE HELD FOR 5 DAYS BEFORE ISSUING A FINAL NEGATIVE REPORT   Report Status PENDING   Incomplete   CULTURE, BLOOD (ROUTINE X 2)     Status: Normal (Preliminary result)   Collection Time   03/01/12  8:10 PM      Component Value Range Status Comment   Specimen Description BLOOD RIGHT HAND   Final    Special Requests BOTTLES DRAWN AEROBIC ONLY 8CC   Final    Culture  Setup Time 03/02/2012 10:21   Final    Culture     Final    Value:        BLOOD CULTURE RECEIVED NO GROWTH TO DATE CULTURE WILL BE HELD FOR 5 DAYS BEFORE ISSUING A FINAL NEGATIVE REPORT   Report Status PENDING   Incomplete   URINE CULTURE     Status: Normal   Collection Time   03/02/12  2:30 PM      Component Value Range Status Comment   Specimen Description URINE, CLEAN CATCH   Final    Special Requests NONE   Final    Culture  Setup Time 03/02/2012 15:38   Final    Colony Count NO GROWTH   Final    Culture NO GROWTH   Final    Report Status 03/03/2012 FINAL   Final      Antibiotics: Anti-infectives     Start     Dose/Rate Route Frequency Ordered Stop   03/03/12 1500   doxycycline (VIBRA-TABS) tablet 100 mg        100 mg Oral Every 12 hours 03/03/12 1444     03/03/12 1500   doxycycline (VIBRA-TABS) tablet 100 mg  Status:  Discontinued        100 mg Oral Every 12 hours 03/03/12 1445 03/03/12 1452           A:  PCT trend normal but he does have hair follicle ? Infection on neck. Has been scratching.  -8/15: skin follicle infectin better P:   Doxy d1 /5  ENDOCRINE  Lab 03/04/12 1058 03/04/12 0904 03/04/12 0454 03/04/12 0346 03/04/12 0221  GLUCAP 167* 132* 149* 140* 120*   A:  DKA.  DM on insulin pump. P:   Per  protocol Diabetic educator  Dr Debara Pickett consult   NEUROLOGIC  A:  No acute Issues P:   monitor  BEST PRACTICE / DISPOSITION Level of Care:  ICU -> tele floor after 3pm bmet is cross checked by elink Primary Service:  PCCM ->TRH pick up 03/05/12 D./w Dr Butler [place] Consultants:  None Code Status:  Full Code Diet:  NPO DVT Px:  Heparin GI Px:  None indicated Skin Integrity:  Intact Social / Family:  Wife updated at bedside 03/02/12 and 03/03/12; she is very concerned and anxious about his health. AT wife request I spent 15 min face to face with conversation with him. He admist etoh coping, erratic life style, work stress all resulting in carb addiction and stress. Says he will make changes to work and diet and start exercising. HE is not interested in zoloft at this stage       Dr. Kalman Shan, M.D., [facility].C.P Pulmonary and Critical Care Medicine Staff Physician [facility] Pager: [phone], If no answer or between  15:00h - 7:00h: call [phone]  [phone]  03/04/2012 2:22 PM

## 2012-03-04 NOTE — Consult Note (Signed)
Reason for Consult: Uncontrolled type 1 diabetes mellitus, admitted for diabetic ketoacidosis.  Referring Physician: Dr. Marrion Coy is an 56 y.o. male.   History of Present Illness:  Jordan Waller has type 1 diabetes mellitus diagnosed in January 2012. He generally monitors his blood glucose 3-5 times a day. With the help of his in some pump, his blood glucose levels have been around 180 mg/dL. However, 3 weeks ago his blood glucose rose into the 300s without apparent precipitating factor. No recent corticosteroid therapy.  A few days before the admission, he attended a convention. He develop nausea with associated vomiting, fatigue, and body aches. He describes flu-like symptoms.  He also had chest pain from Monday, 03/01/2012 which worsened the next day but resolved on Wednesday 03/03/2012. His insulin pump seem to be infusing without apparent malfunction or interruption.  Yet even up to 50 units per day through his pump was not enough to overcome hyperglycemia.  He is now recovering from his intensive care unit stay. When he transition back to the insulin pump with one-hour overlap the with intravenous insulin, his hyperglycemia worsened and his DKA recurred.  He could feel the infusions from the pump and his insulin pump memory confirms delivery of 5.775 units as basal and 10.9 units as boluses yesterday.  He is much better today after the critical care team transition him to a basal bolus since then regimen with Lantus plus NovoLog using insulin pens rather than NovoLog through his insulin pump.  His wife expressed an interest in continuous glucose monitor system for him.  Past Medical History  Diagnosis Date  . Diabetes mellitus   . High cholesterol     Past Surgical History  Procedure Date  . Rotator cuff repair     Family History  Problem Relation Age of Onset  . Colon cancer Mother   . Diabetes type II Mother   . Colon cancer Sister   . Coronary artery disease Neg Hx       No first degree relatives with CAD    Social History:  reports that he has quit smoking. He does not have any smokeless tobacco history on file. He reports that he drinks alcohol. He reports that he does not use illicit drugs.  his alcohol consumption has been "about the same" but "more than I should". He describes 2 servings with 2 ounces of gin per day for 5/7 days a week but no alcohol 2/7 days of the week.  Allergies: No Known Allergies  Review of systems:  General: No weight loss, weight gain since starting insulin after his diagnosis of diabetes. Endocrine:  polydipsia, polyuria. Eyes:   blurry vision, no other change in vision. Cardiovascular: No edema, no chest pain now. Gastrointestinal: no diarrhea. Neurologic: No numbness, no burning in the feet now; but tingling/numbness during severe hyperglycemia. Psychiatric: No anxiety, no depression. Skin: No rash on feet, no foot ulcer. Musculoskeletal: mild generalized muscle weakness, generalized muscle aches. Hematologic/lymphatic:  No bleeding, no bruising, no lymphadenopathy.  Medications: I have reviewed the patient's current medications.  Physical Exam:  Blood pressure 106/76, pulse 78, temperature 97.8 F (36.6 C), temperature source Oral, resp. rate 13, height 5\' 7"  (1.702 m), weight 68.7 kg (151 lb 7.3 oz), SpO2 98.00%.   BMI: Body mass index is 23.72 kg/(m^2).   General: No apparent distress. Eyes: Anicteric, pupils equal and round. Neck: Supple, trachea midline. Thyroid: No enlargement, mobile without fixation, no tenderness. Cardiovascular: Regular rhythm and rate, no  murmur, normal pedal pulses. Respiratory: Normal respiratory effort, clear to auscultation. Gastrointestinal: Normal pitch active bowel sounds, nontender abdomen without distention or appreciable hepatomegaly. Neurologic: Cranial nerves normal as tested, deep tendon reflexes symmetric, normal monofilament sensation in feet. Musculoskeletal: Normal  muscle tone, no muscle atrophy. Skin: Appropriate warmth, no visible rash. Mental status: Alert, conversant, speech clear, thought logical, appropriate mood and affect, no hallucinations or delusions evident. Hematologic/lymphatic: No cervical adenopathy, no jaundice.   Results for orders placed during the hospital encounter of 03/01/12 (from the past 48 hour(s))  URINALYSIS, ROUTINE W REFLEX MICROSCOPIC     Status: Abnormal   Collection Time   03/02/12  2:30 PM      Component Value Range Comment   Color, Urine YELLOW  YELLOW    APPearance CLEAR  CLEAR    Specific Gravity, Urine 1.021  1.005 - 1.030    pH 6.0  5.0 - 8.0    Glucose, UA >1000 (*) NEGATIVE mg/dL    Hgb urine dipstick TRACE (*) NEGATIVE    Bilirubin Urine NEGATIVE  NEGATIVE    Ketones, ur 40 (*) NEGATIVE mg/dL    Protein, ur 30 (*) NEGATIVE mg/dL    Urobilinogen, UA 0.2  0.0 - 1.0 mg/dL    Nitrite NEGATIVE  NEGATIVE    Leukocytes, UA NEGATIVE  NEGATIVE   URINE CULTURE     Status: Normal   Collection Time   03/02/12  2:30 PM      Component Value Range Comment   Specimen Description URINE, CLEAN CATCH      Special Requests NONE      Culture  Setup Time 03/02/2012 15:38      Colony Count NO GROWTH      Culture NO GROWTH      Report Status 03/03/2012 FINAL     URINE MICROSCOPIC-ADD ON     Status: Abnormal   Collection Time   03/02/12  2:30 PM      Component Value Range Comment   Squamous Epithelial / LPF RARE  RARE    WBC, UA 0-2  <3 WBC/hpf    Bacteria, UA RARE  RARE    Casts GRANULAR CAST (*) NEGATIVE    Urine-Other MUCOUS PRESENT     GLUCOSE, CAPILLARY     Status: Abnormal   Collection Time   03/02/12  2:53 PM      Component Value Range Comment   Glucose-Capillary 142 (*) 70 - 99 mg/dL   GLUCOSE, CAPILLARY     Status: Abnormal   Collection Time   03/02/12  3:58 PM      Component Value Range Comment   Glucose-Capillary 148 (*) 70 - 99 mg/dL   CARDIAC PANEL(CRET KIN+CKTOT+MB+TROPI)     Status: Abnormal    Collection Time   03/02/12  4:28 PM      Component Value Range Comment   Total CK 81  7 - 232 U/L    CK, MB 9.4 (*) 0.3 - 4.0 ng/mL CRITICAL VALUE NOTED.  VALUE IS CONSISTENT WITH PREVIOUSLY REPORTED AND CALLED VALUE.   Troponin I 3.66 (*) <0.30 ng/mL    Relative Index RELATIVE INDEX IS INVALID  0.0 - 2.5   HEPARIN LEVEL (UNFRACTIONATED)     Status: Abnormal   Collection Time   03/02/12  4:28 PM      Component Value Range Comment   Heparin Unfractionated <0.10 (*) 0.30 - 0.70 IU/mL   BASIC METABOLIC PANEL     Status: Abnormal   Collection Time  03/02/12  4:28 PM      Component Value Range Comment   Sodium 136  135 - 145 mEq/L    Potassium 3.1 (*) 3.5 - 5.1 mEq/L    Chloride 108  96 - 112 mEq/L    CO2 19  19 - 32 mEq/L    Glucose, Bld 175 (*) 70 - 99 mg/dL    BUN 9  6 - 23 mg/dL    Creatinine, Ser 1.61  0.50 - 1.35 mg/dL    Calcium 7.7 (*) 8.4 - 10.5 mg/dL    GFR calc non Af Amer >90  >90 mL/min    GFR calc Af Amer >90  >90 mL/min   GLUCOSE, CAPILLARY     Status: Abnormal   Collection Time   03/02/12  5:23 PM      Component Value Range Comment   Glucose-Capillary 188 (*) 70 - 99 mg/dL   GLUCOSE, CAPILLARY     Status: Abnormal   Collection Time   03/02/12  6:43 PM      Component Value Range Comment   Glucose-Capillary 169 (*) 70 - 99 mg/dL   GLUCOSE, CAPILLARY     Status: Abnormal   Collection Time   03/02/12  7:56 PM      Component Value Range Comment   Glucose-Capillary 167 (*) 70 - 99 mg/dL   GLUCOSE, CAPILLARY     Status: Abnormal   Collection Time   03/02/12  9:01 PM      Component Value Range Comment   Glucose-Capillary 158 (*) 70 - 99 mg/dL   BASIC METABOLIC PANEL     Status: Abnormal   Collection Time   03/02/12  9:12 PM      Component Value Range Comment   Sodium 135  135 - 145 mEq/L    Potassium 3.3 (*) 3.5 - 5.1 mEq/L    Chloride 106  96 - 112 mEq/L    CO2 21  19 - 32 mEq/L    Glucose, Bld 164 (*) 70 - 99 mg/dL    BUN 8  6 - 23 mg/dL    Creatinine, Ser 0.96   0.50 - 1.35 mg/dL    Calcium 7.7 (*) 8.4 - 10.5 mg/dL    GFR calc non Af Amer >90  >90 mL/min    GFR calc Af Amer >90  >90 mL/min   GLUCOSE, CAPILLARY     Status: Abnormal   Collection Time   03/02/12 10:03 PM      Component Value Range Comment   Glucose-Capillary 171 (*) 70 - 99 mg/dL   GLUCOSE, CAPILLARY     Status: Abnormal   Collection Time   03/02/12 11:03 PM      Component Value Range Comment   Glucose-Capillary 162 (*) 70 - 99 mg/dL   GLUCOSE, CAPILLARY     Status: Abnormal   Collection Time   03/03/12 12:03 AM      Component Value Range Comment   Glucose-Capillary 161 (*) 70 - 99 mg/dL   BASIC METABOLIC PANEL     Status: Abnormal   Collection Time   03/03/12 12:47 AM      Component Value Range Comment   Sodium 135  135 - 145 mEq/L    Potassium 3.1 (*) 3.5 - 5.1 mEq/L    Chloride 105  96 - 112 mEq/L    CO2 20  19 - 32 mEq/L    Glucose, Bld 150 (*) 70 - 99 mg/dL    BUN 6  6 - 23 mg/dL    Creatinine, Ser 1.61  0.50 - 1.35 mg/dL    Calcium 7.7 (*) 8.4 - 10.5 mg/dL    GFR calc non Af Amer >90  >90 mL/min    GFR calc Af Amer >90  >90 mL/min   GLUCOSE, CAPILLARY     Status: Abnormal   Collection Time   03/03/12  1:04 AM      Component Value Range Comment   Glucose-Capillary 148 (*) 70 - 99 mg/dL   GLUCOSE, CAPILLARY     Status: Abnormal   Collection Time   03/03/12  2:03 AM      Component Value Range Comment   Glucose-Capillary 141 (*) 70 - 99 mg/dL   GLUCOSE, CAPILLARY     Status: Abnormal   Collection Time   03/03/12  3:00 AM      Component Value Range Comment   Glucose-Capillary 159 (*) 70 - 99 mg/dL   GLUCOSE, CAPILLARY     Status: Abnormal   Collection Time   03/03/12  4:02 AM      Component Value Range Comment   Glucose-Capillary 131 (*) 70 - 99 mg/dL   HEPARIN LEVEL (UNFRACTIONATED)     Status: Abnormal   Collection Time   03/03/12  4:50 AM      Component Value Range Comment   Heparin Unfractionated 0.10 (*) 0.30 - 0.70 IU/mL   CBC     Status: Abnormal    Collection Time   03/03/12  4:50 AM      Component Value Range Comment   WBC 9.6  4.0 - 10.5 K/uL    RBC 3.84 (*) 4.22 - 5.81 MIL/uL    Hemoglobin 12.1 (*) 13.0 - 17.0 g/dL    HCT 09.6 (*) 04.5 - 52.0 %    MCV 87.5  78.0 - 100.0 fL    MCH 31.5  26.0 - 34.0 pg    MCHC 36.0  30.0 - 36.0 g/dL    RDW 40.9  81.1 - 91.4 %    Platelets 148 (*) 150 - 400 K/uL   PHOSPHORUS     Status: Abnormal   Collection Time   03/03/12  4:50 AM      Component Value Range Comment   Phosphorus 1.2 (*) 2.3 - 4.6 mg/dL   MAGNESIUM     Status: Normal   Collection Time   03/03/12  4:50 AM      Component Value Range Comment   Magnesium 1.7  1.5 - 2.5 mg/dL   BASIC METABOLIC PANEL     Status: Abnormal   Collection Time   03/03/12  4:50 AM      Component Value Range Comment   Sodium 136  135 - 145 mEq/L    Potassium 3.5  3.5 - 5.1 mEq/L    Chloride 107  96 - 112 mEq/L    CO2 21  19 - 32 mEq/L    Glucose, Bld 135 (*) 70 - 99 mg/dL    BUN 6  6 - 23 mg/dL    Creatinine, Ser 7.82  0.50 - 1.35 mg/dL    Calcium 7.8 (*) 8.4 - 10.5 mg/dL    GFR calc non Af Amer >90  >90 mL/min    GFR calc Af Amer >90  >90 mL/min   LIPASE, BLOOD     Status: Abnormal   Collection Time   03/03/12  4:50 AM      Component Value Range Comment   Lipase 63 (*)  11 - 59 U/L   LIPID PANEL     Status: Normal   Collection Time   03/03/12  5:00 AM      Component Value Range Comment   Cholesterol 108  0 - 200 mg/dL    Triglycerides 55  <409 mg/dL    HDL 54  >81 mg/dL    Total CHOL/HDL Ratio 2.0      VLDL 11  0 - 40 mg/dL    LDL Cholesterol 43  0 - 99 mg/dL   PROCALCITONIN     Status: Normal   Collection Time   03/03/12  5:00 AM      Component Value Range Comment   Procalcitonin <0.10     GLUCOSE, CAPILLARY     Status: Abnormal   Collection Time   03/03/12  5:03 AM      Component Value Range Comment   Glucose-Capillary 135 (*) 70 - 99 mg/dL   GLUCOSE, CAPILLARY     Status: Abnormal   Collection Time   03/03/12  6:13 AM      Component  Value Range Comment   Glucose-Capillary 151 (*) 70 - 99 mg/dL   GLUCOSE, CAPILLARY     Status: Abnormal   Collection Time   03/03/12  7:02 AM      Component Value Range Comment   Glucose-Capillary 164 (*) 70 - 99 mg/dL   GLUCOSE, CAPILLARY     Status: Abnormal   Collection Time   03/03/12  8:09 AM      Component Value Range Comment   Glucose-Capillary 162 (*) 70 - 99 mg/dL   BASIC METABOLIC PANEL     Status: Abnormal   Collection Time   03/03/12  9:30 AM      Component Value Range Comment   Sodium 135  135 - 145 mEq/L    Potassium 3.4 (*) 3.5 - 5.1 mEq/L    Chloride 105  96 - 112 mEq/L    CO2 21  19 - 32 mEq/L    Glucose, Bld 186 (*) 70 - 99 mg/dL    BUN 5 (*) 6 - 23 mg/dL    Creatinine, Ser 1.91  0.50 - 1.35 mg/dL    Calcium 7.8 (*) 8.4 - 10.5 mg/dL    GFR calc non Af Amer >90  >90 mL/min    GFR calc Af Amer >90  >90 mL/min   HEPATIC FUNCTION PANEL     Status: Abnormal   Collection Time   03/03/12  9:30 AM      Component Value Range Comment   Total Protein 5.2 (*) 6.0 - 8.3 g/dL    Albumin 2.8 (*) 3.5 - 5.2 g/dL    AST 22  0 - 37 U/L    ALT 10  0 - 53 U/L    Alkaline Phosphatase 55  39 - 117 U/L    Total Bilirubin 0.8  0.3 - 1.2 mg/dL    Bilirubin, Direct 0.2  0.0 - 0.3 mg/dL    Indirect Bilirubin 0.6  0.3 - 0.9 mg/dL   TROPONIN I     Status: Abnormal   Collection Time   03/03/12  9:36 AM      Component Value Range Comment   Troponin I 5.06 (*) <0.30 ng/mL   GLUCOSE, CAPILLARY     Status: Abnormal   Collection Time   03/03/12  9:58 AM      Component Value Range Comment   Glucose-Capillary 180 (*) 70 - 99 mg/dL  GLUCOSE, CAPILLARY     Status: Abnormal   Collection Time   03/03/12 11:01 AM      Component Value Range Comment   Glucose-Capillary 241 (*) 70 - 99 mg/dL   GLUCOSE, CAPILLARY     Status: Abnormal   Collection Time   03/03/12 12:06 PM      Component Value Range Comment   Glucose-Capillary 261 (*) 70 - 99 mg/dL   BASIC METABOLIC PANEL     Status: Abnormal    Collection Time   03/03/12  1:04 PM      Component Value Range Comment   Sodium 133 (*) 135 - 145 mEq/L    Potassium 3.9  3.5 - 5.1 mEq/L    Chloride 102  96 - 112 mEq/L    CO2 18 (*) 19 - 32 mEq/L    Glucose, Bld 290 (*) 70 - 99 mg/dL    BUN 7  6 - 23 mg/dL    Creatinine, Ser 7.82  0.50 - 1.35 mg/dL    Calcium 7.8 (*) 8.4 - 10.5 mg/dL    GFR calc non Af Amer >90  >90 mL/min    GFR calc Af Amer >90  >90 mL/min   HEPARIN LEVEL (UNFRACTIONATED)     Status: Normal   Collection Time   03/03/12  1:04 PM      Component Value Range Comment   Heparin Unfractionated 0.39  0.30 - 0.70 IU/mL   CARDIAC PANEL(CRET KIN+CKTOT+MB+TROPI)     Status: Abnormal   Collection Time   03/03/12  2:56 PM      Component Value Range Comment   Total CK 91  7 - 232 U/L    CK, MB 6.3 (*) 0.3 - 4.0 ng/mL CRITICAL VALUE NOTED.  VALUE IS CONSISTENT WITH PREVIOUSLY REPORTED AND CALLED VALUE.   Troponin I 1.91 (*) <0.30 ng/mL    Relative Index RELATIVE INDEX IS INVALID  0.0 - 2.5   BASIC METABOLIC PANEL     Status: Abnormal   Collection Time   03/03/12  3:00 PM      Component Value Range Comment   Sodium 131 (*) 135 - 145 mEq/L    Potassium 4.4  3.5 - 5.1 mEq/L    Chloride 100  96 - 112 mEq/L    CO2 17 (*) 19 - 32 mEq/L    Glucose, Bld 330 (*) 70 - 99 mg/dL    BUN 9  6 - 23 mg/dL    Creatinine, Ser 9.56  0.50 - 1.35 mg/dL    Calcium 8.0 (*) 8.4 - 10.5 mg/dL    GFR calc non Af Amer >90  >90 mL/min    GFR calc Af Amer >90  >90 mL/min   CBC     Status: Abnormal   Collection Time   03/03/12  3:00 PM      Component Value Range Comment   WBC 8.9  4.0 - 10.5 K/uL    RBC 4.12 (*) 4.22 - 5.81 MIL/uL    Hemoglobin 13.3  13.0 - 17.0 g/dL    HCT 21.3 (*) 08.6 - 52.0 %    MCV 89.3  78.0 - 100.0 fL    MCH 32.3  26.0 - 34.0 pg    MCHC 36.1 (*) 30.0 - 36.0 g/dL    RDW 57.8  46.9 - 62.9 %    Platelets 123 (*) 150 - 400 K/uL   GLUCOSE, CAPILLARY     Status: Abnormal   Collection Time   03/03/12  3:44 PM      Component  Value Range Comment   Glucose-Capillary 347 (*) 70 - 99 mg/dL   BASIC METABOLIC PANEL     Status: Abnormal   Collection Time   03/03/12  4:50 PM      Component Value Range Comment   Sodium 134 (*) 135 - 145 mEq/L    Potassium 4.0  3.5 - 5.1 mEq/L    Chloride 103  96 - 112 mEq/L    CO2 19  19 - 32 mEq/L    Glucose, Bld 341 (*) 70 - 99 mg/dL    BUN 9  6 - 23 mg/dL    Creatinine, Ser 9.60  0.50 - 1.35 mg/dL    Calcium 7.7 (*) 8.4 - 10.5 mg/dL    GFR calc non Af Amer >90  >90 mL/min    GFR calc Af Amer >90  >90 mL/min   GLUCOSE, CAPILLARY     Status: Abnormal   Collection Time   03/03/12  4:54 PM      Component Value Range Comment   Glucose-Capillary 316 (*) 70 - 99 mg/dL   GLUCOSE, CAPILLARY     Status: Abnormal   Collection Time   03/03/12  6:06 PM      Component Value Range Comment   Glucose-Capillary 311 (*) 70 - 99 mg/dL   BASIC METABOLIC PANEL     Status: Abnormal   Collection Time   03/03/12  6:56 PM      Component Value Range Comment   Sodium 134 (*) 135 - 145 mEq/L    Potassium 3.8  3.5 - 5.1 mEq/L    Chloride 102  96 - 112 mEq/L    CO2 22  19 - 32 mEq/L    Glucose, Bld 284 (*) 70 - 99 mg/dL    BUN 9  6 - 23 mg/dL    Creatinine, Ser 4.54  0.50 - 1.35 mg/dL    Calcium 7.8 (*) 8.4 - 10.5 mg/dL    GFR calc non Af Amer >90  >90 mL/min    GFR calc Af Amer >90  >90 mL/min   GLUCOSE, CAPILLARY     Status: Abnormal   Collection Time   03/03/12  7:14 PM      Component Value Range Comment   Glucose-Capillary 235 (*) 70 - 99 mg/dL   HEPARIN LEVEL (UNFRACTIONATED)     Status: Normal   Collection Time   03/03/12  8:16 PM      Component Value Range Comment   Heparin Unfractionated 0.37  0.30 - 0.70 IU/mL   BASIC METABOLIC PANEL     Status: Abnormal   Collection Time   03/03/12  8:16 PM      Component Value Range Comment   Sodium 139  135 - 145 mEq/L    Potassium 3.8  3.5 - 5.1 mEq/L    Chloride 105  96 - 112 mEq/L    CO2 25  19 - 32 mEq/L    Glucose, Bld 209 (*) 70 - 99  mg/dL    BUN 8  6 - 23 mg/dL    Creatinine, Ser 0.98  0.50 - 1.35 mg/dL    Calcium 8.2 (*) 8.4 - 10.5 mg/dL    GFR calc non Af Amer >90  >90 mL/min    GFR calc Af Amer >90  >90 mL/min   BASIC METABOLIC PANEL     Status: Abnormal   Collection Time   03/03/12  8:23 PM  Component Value Range Comment   Sodium 134 (*) 135 - 145 mEq/L    Potassium 3.2 (*) 3.5 - 5.1 mEq/L    Chloride 103  96 - 112 mEq/L    CO2 23  19 - 32 mEq/L    Glucose, Bld 220 (*) 70 - 99 mg/dL    BUN 8  6 - 23 mg/dL    Creatinine, Ser 0.45  0.50 - 1.35 mg/dL    Calcium 7.8 (*) 8.4 - 10.5 mg/dL    GFR calc non Af Amer >90  >90 mL/min    GFR calc Af Amer >90  >90 mL/min   GLUCOSE, CAPILLARY     Status: Abnormal   Collection Time   03/03/12  8:25 PM      Component Value Range Comment   Glucose-Capillary 193 (*) 70 - 99 mg/dL   GLUCOSE, CAPILLARY     Status: Abnormal   Collection Time   03/03/12  9:30 PM      Component Value Range Comment   Glucose-Capillary 182 (*) 70 - 99 mg/dL   CARDIAC PANEL(CRET KIN+CKTOT+MB+TROPI)     Status: Abnormal   Collection Time   03/03/12 10:14 PM      Component Value Range Comment   Total CK 74  7 - 232 U/L    CK, MB 4.8 (*) 0.3 - 4.0 ng/mL    Troponin I 3.97 (*) <0.30 ng/mL    Relative Index RELATIVE INDEX IS INVALID  0.0 - 2.5   GLUCOSE, CAPILLARY     Status: Abnormal   Collection Time   03/03/12 10:40 PM      Component Value Range Comment   Glucose-Capillary 159 (*) 70 - 99 mg/dL   GLUCOSE, CAPILLARY     Status: Abnormal   Collection Time   03/04/12 12:02 AM      Component Value Range Comment   Glucose-Capillary 149 (*) 70 - 99 mg/dL    Comment 1 Documented in Chart      Comment 2 Notify RN     BASIC METABOLIC PANEL     Status: Abnormal   Collection Time   03/04/12 12:55 AM      Component Value Range Comment   Sodium 136  135 - 145 mEq/L    Potassium 3.4 (*) 3.5 - 5.1 mEq/L    Chloride 103  96 - 112 mEq/L    CO2 25  19 - 32 mEq/L    Glucose, Bld 140 (*) 70 - 99 mg/dL     BUN 6  6 - 23 mg/dL    Creatinine, Ser 4.09  0.50 - 1.35 mg/dL    Calcium 7.9 (*) 8.4 - 10.5 mg/dL    GFR calc non Af Amer >90  >90 mL/min    GFR calc Af Amer >90  >90 mL/min   GLUCOSE, CAPILLARY     Status: Abnormal   Collection Time   03/04/12  1:06 AM      Component Value Range Comment   Glucose-Capillary 139 (*) 70 - 99 mg/dL   GLUCOSE, CAPILLARY     Status: Abnormal   Collection Time   03/04/12  2:21 AM      Component Value Range Comment   Glucose-Capillary 120 (*) 70 - 99 mg/dL   GLUCOSE, CAPILLARY     Status: Abnormal   Collection Time   03/04/12  3:46 AM      Component Value Range Comment   Glucose-Capillary 140 (*) 70 - 99 mg/dL   HEPARIN  LEVEL (UNFRACTIONATED)     Status: Abnormal   Collection Time   03/04/12  4:15 AM      Component Value Range Comment   Heparin Unfractionated 0.26 (*) 0.30 - 0.70 IU/mL   CBC     Status: Abnormal   Collection Time   03/04/12  4:15 AM      Component Value Range Comment   WBC 7.7  4.0 - 10.5 K/uL    RBC 4.06 (*) 4.22 - 5.81 MIL/uL    Hemoglobin 12.9 (*) 13.0 - 17.0 g/dL    HCT 16.1 (*) 09.6 - 52.0 %    MCV 87.4  78.0 - 100.0 fL    MCH 31.8  26.0 - 34.0 pg    MCHC 36.3 (*) 30.0 - 36.0 g/dL    RDW 04.5  40.9 - 81.1 %    Platelets 159  150 - 400 K/uL   BASIC METABOLIC PANEL     Status: Abnormal   Collection Time   03/04/12  4:15 AM      Component Value Range Comment   Sodium 137  135 - 145 mEq/L    Potassium 3.2 (*) 3.5 - 5.1 mEq/L    Chloride 102  96 - 112 mEq/L    CO2 24  19 - 32 mEq/L    Glucose, Bld 143 (*) 70 - 99 mg/dL    BUN 5 (*) 6 - 23 mg/dL    Creatinine, Ser 9.14  0.50 - 1.35 mg/dL    Calcium 8.2 (*) 8.4 - 10.5 mg/dL    GFR calc non Af Amer >90  >90 mL/min    GFR calc Af Amer >90  >90 mL/min   PHOSPHORUS     Status: Abnormal   Collection Time   03/04/12  4:15 AM      Component Value Range Comment   Phosphorus 1.6 (*) 2.3 - 4.6 mg/dL   MAGNESIUM     Status: Normal   Collection Time   03/04/12  4:15 AM       Component Value Range Comment   Magnesium 1.8  1.5 - 2.5 mg/dL   CARDIAC PANEL(CRET KIN+CKTOT+MB+TROPI)     Status: Abnormal   Collection Time   03/04/12  4:15 AM      Component Value Range Comment   Total CK 70  7 - 232 U/L    CK, MB 4.0  0.3 - 4.0 ng/mL    Troponin I 2.49 (*) <0.30 ng/mL    Relative Index RELATIVE INDEX IS INVALID  0.0 - 2.5   GLUCOSE, CAPILLARY     Status: Abnormal   Collection Time   03/04/12  4:54 AM      Component Value Range Comment   Glucose-Capillary 149 (*) 70 - 99 mg/dL   GLUCOSE, CAPILLARY     Status: Abnormal   Collection Time   03/04/12  9:04 AM      Component Value Range Comment   Glucose-Capillary 132 (*) 70 - 99 mg/dL   BASIC METABOLIC PANEL     Status: Abnormal   Collection Time   03/04/12  9:06 AM      Component Value Range Comment   Sodium 135  135 - 145 mEq/L    Potassium 3.2 (*) 3.5 - 5.1 mEq/L    Chloride 98  96 - 112 mEq/L    CO2 27  19 - 32 mEq/L    Glucose, Bld 140 (*) 70 - 99 mg/dL    BUN 5 (*) 6 -  23 mg/dL    Creatinine, Ser 6.21  0.50 - 1.35 mg/dL    Calcium 8.2 (*) 8.4 - 10.5 mg/dL    GFR calc non Af Amer >90  >90 mL/min    GFR calc Af Amer >90  >90 mL/min   PROTIME-INR     Status: Abnormal   Collection Time   03/04/12  9:06 AM      Component Value Range Comment   Prothrombin Time 20.8 (*) 11.6 - 15.2 seconds    INR 1.76 (*) 0.00 - 1.49   GLUCOSE, CAPILLARY     Status: Abnormal   Collection Time   03/04/12 10:58 AM      Component Value Range Comment   Glucose-Capillary 167 (*) 70 - 99 mg/dL    Comment 1 Notify RN       Assessment/Plan: 1.  Type 1 diabetes mellitus, uncontrolled. 2.  Diabetic ketoacidosis, resolved.   We discussed the potential contributing factors to his diabetic ketoacidosis. He has recovered rather well now.  I agree with a plan to use Lantus insulin and NovoLog insulin with insulin pens until he can follow-up with me and Cristy Folks, RN, BSN, CDE early next week.  Then we can consider transition back  to his insulin pump.    He just received a dose of NovoLog with his lunch, about 1 hour before my visit.  I plan to monitor his glycemic control and adjust the doses as necessary.  We discussed the anticipated benefits, alternatives, nature of device, etc. for continuous glucose monitor system. I provided packets and other forms for both the Dexcom G4 and Medtronic Guardian.  He agreed to read the materials and will submit a request for one of the two options after he meets with me and Cristy Folks early next week.    The patient was instructed to call [phone] to schedule an office visit with me (Dr. Sharl Ma) and Cristy Folks on Tuesday, March 09, 2012 at [facility], [address] [employer], [address], [place], [place] [address].  We can change those appointments if he is discharged from the hospital earlier or later than anticipated.    Thank you to all of the team for your care of Mr. Console.    Tashima Scarpulla 03/04/2012, 2:11 PM

## 2012-03-04 NOTE — Progress Notes (Signed)
5:38 AM   Heparin slightly subtherapeutic this am down to 0.26 after 2 days of therapeutic levels at rate1500units/hr. Increasing  to 1650/hr with daily f/u 8/16. H/h are normal. No reported bleeding or infusion issues.   Janice Coffin

## 2012-03-04 NOTE — Progress Notes (Signed)
DKA management. Gap closed; transition from iv to sq insulin.

## 2012-03-05 DIAGNOSIS — E872 Acidosis: Secondary | ICD-10-CM

## 2012-03-05 LAB — CBC
HCT: 40 % (ref 39.0–52.0)
MCH: 31.8 pg (ref 26.0–34.0)
MCHC: 35.5 g/dL (ref 30.0–36.0)
MCV: 89.7 fL (ref 78.0–100.0)
RDW: 12.8 % (ref 11.5–15.5)

## 2012-03-05 LAB — BASIC METABOLIC PANEL
BUN: 11 mg/dL (ref 6–23)
Calcium: 9.1 mg/dL (ref 8.4–10.5)
Creatinine, Ser: 0.76 mg/dL (ref 0.50–1.35)
GFR calc Af Amer: >90 mL/min (ref >90)
GFR calc non Af Amer: >90 mL/min (ref >90)

## 2012-03-05 LAB — GLUCOSE, CAPILLARY
Glucose-Capillary: 207 mg/dL — ABNORMAL HIGH (ref 70–99)
Glucose-Capillary: 220 mg/dL — ABNORMAL HIGH (ref 70–99)

## 2012-03-05 MED ORDER — ASPIRIN EC 81 MG PO TBEC
81.0000 mg | DELAYED_RELEASE_TABLET | Freq: Every day | ORAL | Status: DC
  Administered 2012-03-05 – 2012-03-06 (×2): 81 mg via ORAL
  Filled 2012-03-05 (×2): qty 1

## 2012-03-05 MED ORDER — INSULIN ASPART 100 UNIT/ML ~~LOC~~ SOLN
4.0000 [IU] | Freq: Three times a day (TID) | SUBCUTANEOUS | Status: DC
  Administered 2012-03-05 (×2): 6 [IU] via SUBCUTANEOUS
  Administered 2012-03-05: 7 [IU] via SUBCUTANEOUS
  Administered 2012-03-06: 11 [IU] via SUBCUTANEOUS

## 2012-03-05 MED ORDER — INSULIN ASPART 100 UNIT/ML ~~LOC~~ SOLN
0.0000 [IU] | Freq: Every day | SUBCUTANEOUS | Status: DC
  Administered 2012-03-05: 1 [IU] via SUBCUTANEOUS

## 2012-03-05 MED FILL — Dextrose Inj 5%: INTRAVENOUS | Qty: 1000 | Status: AC

## 2012-03-05 NOTE — Progress Notes (Signed)
Subjective: Jordan Waller feels much better today, "better than I have in a month".  He slept well last night.  Less frequent blood glucose testing helped.  He notes that he is no longer tethered to intravenous fluids.  That has helped as well.  He appreciates the improvement in glycemic control [as provided by the critical care team, hospitalist, and diabetes coordinator].    He is eating well and suspects that he ate about 60 grams of carbohydrate at lunch and at supper yesterday.    His insulin pump settings, prior to the hospital stay, provided 1 unit for every 9 grams of estimated carbohydrate intake at meals, 1 unit for every 34 mg/dL of blood glucose above the target of 100 mg/dL, and 16.1 units of basal insulin per day.    Review of systems:  No nausea, vomiting, polyuria, or polydipsia.  Objective: Vital signs in last 24 hours: Temp:  [97.8 F (36.6 C)-98.2 F (36.8 C)] 98.1 F (36.7 C) (08/16 0521) Pulse Rate:  [65-86] 65  (08/16 0521) Resp:  [11-18] 16  (08/16 0521) BP: (102-126)/(63-83) 103/64 mmHg (08/16 0521) SpO2:  [97 %-99 %] 98 % (08/16 0521) Weight change:  Last BM Date: 02/29/12  Intake/Output from previous day: 08/15 0701 - 08/16 0700 In: 475 [I.V.:225; IV Piggyback:250] Out: 1800 [Urine:1800] Intake/Output this shift:    General: No apparent distress. Eyes: Anicteric, pupils equal and round. Neck: Supple, trachea midline. Cardiovascular: Regular rhythm and rate, no murmur. Respiratory: Normal respiratory effort, clear to auscultation. Gastrointestinal: Normal pitch active bowel sounds, nontender abdomen without distention or appreciable hepatomegaly. Neurologic: Cranial nerves normal as tested. Musculoskeletal: Normal muscle tone, no muscle atrophy. Skin: Appropriate warmth, no visible rash. Mental status: Alert, conversant, speech clear, thought logical, appropriate mood and affect, no hallucinations or delusions evident.   Lab Results:  Basename  03/05/12 0510 03/04/12 0415  WBC 8.3 7.7  HGB 14.2 12.9*  HCT 40.0 35.5*  PLT 182 159   BMET  Basename 03/05/12 0510 03/04/12 1505  NA 138 134*  K 4.2 4.0  CL 100 96  CO2 28 28  GLUCOSE 197* 229*  BUN 11 7  CREATININE 0.76 0.84  CALCIUM 9.1 8.9    Blood glucose 167 mg/dL before lunch yesterday--5 units of NovoLog insulin provided. Blood glucose 207 mg/dL before supper yesterday--6 units of NovoLog insulin provided. Blood glucose 260 mg/dL at bedtime yesterday--3 units of NovoLog insulin and 15 units of Lantus insulin provided. Blood glucose 197 mg/dL upon awakening this morning.  Medications: I have reviewed the patient's current medications.  Assessment/Plan: 1.  Type 1 diabetes mellitus, uncontrolled.   2.  Diabetic ketoacidosis, resolved.  His glycemic control has been reasonable during the past 24 hours.    I agree with the diabetes coordinator.  This morning, I changed his NovoLog insulin at mealtimes to provide 4 units at each meal plus 1 additional unit for every 30 mg/dL above a target of 096 mg/dL.  This provides a comparable amount of insulin for correction of hyperglycemia as the "moderate" sliding scale in the hospital.  The new orders provide just a bit more than what his insulin pump settings would have provided for correction of hyperglycemia, but less than what his insulin pump settings would have provided for his estimated carbohydrate intake.    Yesterday, the new orders for NovoLog insulin would have provided 1 more unit at lunch and 1 more unit at supper than what he received.  Even just that one extra unit  at those meals might have been sufficient to prevent a rise in blood glucose.    His blood glucose level dropped overnight (from worse hyperglycemia to less hyperglycemia).  For now, continue Lantus 15 units subcutaneous once a day at bedtime.  However, if it does not appear sufficient, it may be raised to 16 units subcutaneous once a day at bedtime.    He  has type 1 diabetes mellitus.  He has demonstrated that he is insulin dependent and insulin sensitive.  The key is to make modest gradual adjustments to his insulin doses and avoid interruptions in insulin therapy.     LOS: 4 days   Alasia Enge 03/05/2012, 7:21 AM

## 2012-03-05 NOTE — Progress Notes (Signed)
SUBJECTIVE: No chest pain or SOB.   BP 103/64  Pulse 65  Temp 98.1 F (36.7 C) (Oral)  Resp 16  Ht 5\' 7"  (1.702 m)  Wt 151 lb 7.3 oz (68.7 kg)  BMI 23.72 kg/m2  SpO2 98%  Intake/Output Summary (Last 24 hours) at 03/05/12 4696 Last data filed at 03/04/12 1700  Gross per 24 hour  Intake    475 ml  Output   1800 ml  Net  -1325 ml    PHYSICAL EXAM General: Well developed, well nourished, in no acute distress. Alert and oriented x 3.  Psych:  Good affect, responds appropriately Neck: No JVD. No masses noted.  Lungs: Clear bilaterally with no wheezes or rhonci noted.  Heart: RRR with no murmurs noted. Abdomen: Bowel sounds are present. Soft, non-tender.  Extremities: No lower extremity edema. Right groin without hematoma.   LABS: Basic Metabolic Panel:  Basename 03/05/12 0510 03/04/12 1505 03/04/12 0415  NA 138 134* --  K 4.2 4.0 --  CL 100 96 --  CO2 28 28 --  GLUCOSE 197* 229* --  BUN 11 7 --  CREATININE 0.76 0.84 --  CALCIUM 9.1 8.9 --  MG 2.0 -- 1.8  PHOS 3.1 -- 1.6*   CBC:  Basename 03/05/12 0510 03/04/12 0415  WBC 8.3 7.7  NEUTROABS -- --  HGB 14.2 12.9*  HCT 40.0 35.5*  MCV 89.7 87.4  PLT 182 159   Cardiac Enzymes:  Basename 03/04/12 0415 03/03/12 2214 03/03/12 1456  CKTOTAL 70 74 91  CKMB 4.0 4.8* 6.3*  CKMBINDEX -- -- --  TROPONINI 2.49* 3.97* 1.91*   Fasting Lipid Panel:  Basename 03/03/12 0500  CHOL 108  HDL 54  LDLCALC 43  TRIG 55  CHOLHDL 2.0  LDLDIRECT --    Current Meds:    . antiseptic oral rinse  15 mL Mouth Rinse q12n4p  . aspirin  324 mg Oral Pre-Cath  . atorvastatin  40 mg Oral q1800  . bivalirudin      . chlorhexidine  15 mL Mouth Rinse BID  . colchicine  0.6 mg Oral BID  . doxycycline  100 mg Oral Q12H  . fentaNYL      . heparin      . insulin aspart  0-15 Units Subcutaneous TID WC  . insulin aspart  0-5 Units Subcutaneous QHS  . insulin glargine  15 Units Subcutaneous QHS  . lidocaine      . magnesium  sulfate 1 - 4 g bolus IVPB  2 g Intravenous Once  . metoprolol tartrate  25 mg Oral BID  . midazolam      . midazolam      . nitroGLYCERIN      . pantoprazole  40 mg Oral Q1200  . pneumococcal 23 valent vaccine  0.5 mL Intramuscular Tomorrow-1000  . potassium chloride  30 mEq Oral Q4H  . potassium phosphate IVPB (mmol)  24 mmol Intravenous Once  . prasugrel      . prasugrel  10 mg Oral Daily  . sodium chloride  3 mL Intravenous Q12H  . DISCONTD: aspirin EC  81 mg Oral Daily  . DISCONTD: insulin aspart  0-4 Units Subcutaneous Q4H  . DISCONTD: insulin aspart  2 Units Subcutaneous Q4H  . DISCONTD: insulin glargine  14 Units Subcutaneous Q24H  . DISCONTD: insulin pump   Subcutaneous TID AC, HS, 0200     ASSESSMENT AND PLAN:  1. CAD/NSTEMI: Cardiac cath yesterday with severe mid LAD stenosis,  now s/p DES x 1 mid LAD. He did well overnight. He will need ASA 81 mg po QDaily and Effient 10 mg po Qdaily for at least one year. Continue metoprolol and statin. In regards to his cardiac issues, he could be discharged home at any point. He can follow up in our [facility] cardiology office with me in 3-4 weeks.  Please make sure that case management has given him an Effient starter packet before he leaves.   2. DM/DKA: Per primary team.   MCALHANY,CHRISTOPHER  8/16/20137:12 AM

## 2012-03-05 NOTE — Progress Notes (Signed)
Patient ID: Jordan Waller  male  ZOX:[id number]    DOB: 12-08-1955    DOA: 03/01/2012  PCP: No primary provider on file.  Subjective: No specific complaints, no chest pain or shortness of breath, fevers, chills or coughing  Objective: Weight change:   Intake/Output Summary (Last 24 hours) at 03/05/12 1240 Last data filed at 03/05/12 0800  Gross per 24 hour  Intake    700 ml  Output   1800 ml  Net  -1100 ml   Blood pressure 107/69, pulse 79, temperature 98.1 F (36.7 C), temperature source Oral, resp. rate 16, height 5\' 7"  (1.702 m), weight 68.7 kg (151 lb 7.3 oz), SpO2 98.00%.  Physical Exam: General: Alert and awake, oriented x3, not in any acute distress. HEENT: anicteric sclera, pupils reactive to light and accommodation, EOMI CVS: S1-S2 clear, no murmur rubs or gallops Chest: clear to auscultation bilaterally, no wheezing, rales or rhonchi Abdomen: soft nontender, nondistended, normal bowel sounds, no organomegaly Extremities: no cyanosis, clubbing or edema noted bilaterally Neuro: Cranial nerves II-XII intact, no focal neurological deficits  Lab Results: Basic Metabolic Panel:  Lab 03/05/12 8119 03/04/12 1505  NA 138 134*  K 4.2 4.0  CL 100 96  CO2 28 28  GLUCOSE 197* 229*  BUN 11 7  CREATININE 0.76 0.84  CALCIUM 9.1 8.9  MG 2.0 --  PHOS 3.1 --   Liver Function Tests:  Lab 03/03/12 0930 03/01/12 1010  AST 22 14  ALT 10 18  ALKPHOS 55 112  BILITOT 0.8 0.5  PROT 5.2* 8.7*  ALBUMIN 2.8* 5.5*    Lab 03/03/12 0450 03/02/12 0213  LIPASE 63* 223*  AMYLASE -- --   No results found for this basename: AMMONIA:2 in the last 168 hours CBC:  Lab 03/05/12 0510 03/04/12 0415 03/01/12 1010  WBC 8.3 7.7 --  NEUTROABS -- -- 17.3*  HGB 14.2 12.9* --  HCT 40.0 35.5* --  MCV 89.7 87.4 --  PLT 182 159 --   Cardiac Enzymes:  Lab 03/04/12 0415 03/03/12 2214 03/03/12 1456  CKTOTAL 70 74 91  CKMB 4.0 4.8* 6.3*  CKMBINDEX -- -- --  TROPONINI 2.49* 3.97* 1.91*    BNP: No components found with this basename: POCBNP:2 CBG:  Lab 03/05/12 1131 03/05/12 0739 03/04/12 1946 03/04/12 1547 03/04/12 1058  GLUCAP 172* 207* 260* 207* 167*     Micro Results: Recent Results (from the past 240 hour(s))  MRSA PCR SCREENING     Status: Normal   Collection Time   03/01/12  5:30 PM      Component Value Range Status Comment   MRSA by PCR NEGATIVE  NEGATIVE Final   CULTURE, BLOOD (ROUTINE X 2)     Status: Normal (Preliminary result)   Collection Time   03/01/12  8:03 PM      Component Value Range Status Comment   Specimen Description BLOOD RIGHT ARM   Final    Special Requests BOTTLES DRAWN AEROBIC AND ANAEROBIC 10CC EACH   Final    Culture  Setup Time 03/02/2012 01:51   Final    Culture     Final    Value:        BLOOD CULTURE RECEIVED NO GROWTH TO DATE CULTURE WILL BE HELD FOR 5 DAYS BEFORE ISSUING A FINAL NEGATIVE REPORT   Report Status PENDING   Incomplete   CULTURE, BLOOD (ROUTINE X 2)     Status: Normal (Preliminary result)   Collection Time   03/01/12  8:10  PM      Component Value Range Status Comment   Specimen Description BLOOD RIGHT HAND   Final    Special Requests BOTTLES DRAWN AEROBIC ONLY 8CC   Final    Culture  Setup Time 03/02/2012 10:21   Final    Culture     Final    Value:        BLOOD CULTURE RECEIVED NO GROWTH TO DATE CULTURE WILL BE HELD FOR 5 DAYS BEFORE ISSUING A FINAL NEGATIVE REPORT   Report Status PENDING   Incomplete   URINE CULTURE     Status: Normal   Collection Time   03/02/12  2:30 PM      Component Value Range Status Comment   Specimen Description URINE, CLEAN CATCH   Final    Special Requests NONE   Final    Culture  Setup Time 03/02/2012 15:38   Final    Colony Count NO GROWTH   Final    Culture NO GROWTH   Final    Report Status 03/03/2012 FINAL   Final     Studies/Results: Dg Chest 2 View  03/01/2012  *RADIOLOGY REPORT*  Clinical Data: Vomiting, dehydration, history of diabetes  CHEST - 2 VIEW  Comparison:  None.  Findings: Normal cardiac silhouette and mediastinal contours.  No focal parenchymal masses.  No pleural effusion or pneumothorax.  No acute osseous abnormalities.  IMPRESSION: No acute cardiopulmonary disease.  Original Report Authenticated By: Waynard Reeds, M.D.    Medications: Scheduled Meds:   . antiseptic oral rinse  15 mL Mouth Rinse q12n4p  . aspirin EC  81 mg Oral Daily  . atorvastatin  40 mg Oral q1800  . chlorhexidine  15 mL Mouth Rinse BID  . colchicine  0.6 mg Oral BID  . doxycycline  100 mg Oral Q12H  . insulin aspart  0-6 Units Subcutaneous QHS  . insulin aspart  4-13 Units Subcutaneous TID WC  . insulin glargine  15 Units Subcutaneous QHS  . metoprolol tartrate  25 mg Oral BID  . pantoprazole  40 mg Oral Q1200  . potassium chloride  30 mEq Oral Q4H  . potassium phosphate IVPB (mmol)  24 mmol Intravenous Once  . prasugrel  10 mg Oral Daily  . sodium chloride  3 mL Intravenous Q12H  . DISCONTD: insulin aspart  0-15 Units Subcutaneous TID WC  . DISCONTD: insulin aspart  0-4 Units Subcutaneous Q4H  . DISCONTD: insulin aspart  0-5 Units Subcutaneous QHS  . DISCONTD: insulin aspart  2 Units Subcutaneous Q4H  . DISCONTD: insulin glargine  14 Units Subcutaneous Q24H   Continuous Infusions:   . sodium chloride 10 mL/hr (03/04/12 0615)     Assessment/Plan: Active Problems:  DKA (diabetic ketoacidoses): Resolved  Type 1 diabetes:  - Highly appreciate endocrinology following, reviewed Dr. Daune Perch recommendations, on Lantus 15units, SSI - Blood sugar somewhat improved today compared to yesterday   Metabolic acidosis: Likely secondary to acute DKA, resolved    Nausea & vomiting: Likely secondary to mild acute pancreatitis resolved, lipase 810 on 8/12 improved to 63 on 03/03/2012, patient tolerating diet   Diarrhea: Resolved   NSTEMI (non-ST elevated myocardial infarction): - Status post cardiac cath, cardiology following, severe mid LAD stenosis, status post  and DES x1 mid LAD. Continue aspirin and effient.  DVT Prophylaxis:  Code Status:  Disposition:DC home tomorrow, discussed with the case manager regarding the effient starter pack    LOS: 4 days   Shiquita Collignon M.D. [facility] 03/05/2012, 12:40 PM Pager: [phone]  If 7PM-7AM, please contact night-coverage [email or web address] Password [email or web address]

## 2012-03-05 NOTE — Progress Notes (Signed)
CARDIAC REHAB PHASE I   PRE:  Rate/Rhythm: 81 SR    BP: sitting 104/70    SaO2: 97 RA  MODE:  Ambulation: 500 ft   POST:  Rate/Rhythm: 96 SR    BP: sitting 110/80     SaO2:   Pt weak and stiff. Had not been walking around since Sunday. Got stronger with distance. Overall feels well. Ed completed, good reception. Knows he needs to do something about stress/work. sts he will drink less if less stress. Agreeable to CRPII and will send referral to [place]. [id number]  Harriet Masson CES, ACSM

## 2012-03-06 DIAGNOSIS — Z9861 Coronary angioplasty status: Secondary | ICD-10-CM

## 2012-03-06 LAB — GLUCOSE, CAPILLARY: Glucose-Capillary: 195 mg/dL — ABNORMAL HIGH (ref 70–99)

## 2012-03-06 MED ORDER — METOPROLOL TARTRATE 25 MG PO TABS: 25.0000 mg | ORAL_TABLET | Freq: Two times a day (BID) | ORAL | Status: DC

## 2012-03-06 MED ORDER — PANTOPRAZOLE SODIUM 40 MG PO TBEC: 40.0000 mg | DELAYED_RELEASE_TABLET | Freq: Every day | ORAL | Status: DC

## 2012-03-06 MED ORDER — INSULIN ASPART 100 UNIT/ML ~~LOC~~ SOLN: 4.0000 [IU] | Freq: Three times a day (TID) | SUBCUTANEOUS | Status: AC

## 2012-03-06 MED ORDER — COLCHICINE 0.6 MG PO TABS
0.6000 mg | ORAL_TABLET | Freq: Every day | ORAL | Status: DC
  Administered 2012-03-06: 0.6 mg via ORAL
  Filled 2012-03-06: qty 1

## 2012-03-06 MED ORDER — INSULIN GLARGINE 100 UNIT/ML ~~LOC~~ SOLN: 15.0000 [IU] | Freq: Every day | SUBCUTANEOUS | Status: DC

## 2012-03-06 MED ORDER — PRASUGREL HCL 10 MG PO TABS: 10.0000 mg | ORAL_TABLET | Freq: Every day | ORAL | Status: DC

## 2012-03-06 MED ORDER — ASPIRIN 81 MG PO TBEC: 81.0000 mg | DELAYED_RELEASE_TABLET | Freq: Every day | ORAL | Status: AC

## 2012-03-06 MED ORDER — COLCHICINE 0.6 MG PO TABS: 0.6000 mg | ORAL_TABLET | Freq: Every day | ORAL | Status: DC

## 2012-03-06 MED ORDER — DOXYCYCLINE HYCLATE 100 MG PO TABS: 100.0000 mg | ORAL_TABLET | Freq: Two times a day (BID) | ORAL | Status: AC

## 2012-03-06 NOTE — Progress Notes (Signed)
Patient ID: Jordan Waller, male   DOB: [birth year], 56 y.o.   MRN: [id number]    SUBJECTIVE: No chest pain or SOB.  Ready to go home  BP 90/56  Pulse 58  Temp 97.7 F (36.5 C) (Oral)  Resp 18  Ht 5\' 7"  (1.702 m)  Wt 151 lb 7.3 oz (68.7 kg)  BMI 23.72 kg/m2  SpO2 99%  Intake/Output Summary (Last 24 hours) at 03/06/12 1028 Last data filed at 03/05/12 1200  Gross per 24 hour  Intake    400 ml  Output      0 ml  Net    400 ml    PHYSICAL EXAM General: Well developed, well nourished, in no acute distress. Alert and oriented x 3.  Psych:  Good affect, responds appropriately Neck: No JVD. No masses noted.  Lungs: Clear bilaterally with no wheezes or rhonci noted.  Heart: RRR with no murmurs noted. Abdomen: Bowel sounds are present. Soft, non-tender.  Extremities: No lower extremity edema. Right groin without hematoma.   LABS: Basic Metabolic Panel:  Basename 03/05/12 0510 03/04/12 1505 03/04/12 0415  NA 138 134* --  K 4.2 4.0 --  CL 100 96 --  CO2 28 28 --  GLUCOSE 197* 229* --  BUN 11 7 --  CREATININE 0.76 0.84 --  CALCIUM 9.1 8.9 --  MG 2.0 -- 1.8  PHOS 3.1 -- 1.6*   CBC:  Basename 03/05/12 0510 03/04/12 0415  WBC 8.3 7.7  NEUTROABS -- --  HGB 14.2 12.9*  HCT 40.0 35.5*  MCV 89.7 87.4  PLT 182 159   Cardiac Enzymes:  Basename 03/04/12 0415 03/03/12 2214 03/03/12 1456  CKTOTAL 70 74 91  CKMB 4.0 4.8* 6.3*  CKMBINDEX -- -- --  TROPONINI 2.49* 3.97* 1.91*   Fasting Lipid Panel: No results found for this basename: CHOL,HDL,LDLCALC,TRIG,CHOLHDL,LDLDIRECT in the last 72 hours  Current Meds:    . antiseptic oral rinse  15 mL Mouth Rinse q12n4p  . aspirin EC  81 mg Oral Daily  . atorvastatin  40 mg Oral q1800  . chlorhexidine  15 mL Mouth Rinse BID  . colchicine  0.6 mg Oral Daily  . doxycycline  100 mg Oral Q12H  . insulin aspart  0-6 Units Subcutaneous QHS  . insulin aspart  4-13 Units Subcutaneous TID WC  . insulin glargine  15 Units Subcutaneous  QHS  . metoprolol tartrate  25 mg Oral BID  . pantoprazole  40 mg Oral Q1200  . prasugrel  10 mg Oral Daily  . sodium chloride  3 mL Intravenous Q12H  . DISCONTD: colchicine  0.6 mg Oral BID     ASSESSMENT AND PLAN:  1. CAD/NSTEMI: Cardiac cath  with severe mid LAD stenosis, now s/p DES x 1 mid LAD. He did well overnight. He will need ASA 81 mg po QDaily and Effient 10 mg po Qdaily for at least one year. Continue metoprolol and statin. In regards to his cardiac issues, he could be discharged home at any point. He can follow up in our [facility] cardiology office with me in 3-4 weeks.  Effient starter pack given  2. DM/DKA: Per primary team.   Charlton Haws  8/17/201310:28 AM

## 2012-03-06 NOTE — Progress Notes (Signed)
Subjective: Jordan Waller feels well today.  He slept well last night.  He plans to improve his work schedule and home life.  He plans to focus on consistent eating habits, regular exercise, consistent meal schedule, consistent work schedule, etc.  He anticipates returning home today and will visit me on Monday, 03/08/2012.  His insulin pump settings, prior to the hospital stay, provided 1 unit for every 9 grams of estimated carbohydrate intake at meals, 1 unit for every 34 mg/dL of blood glucose above the target of 100 mg/dL, and 04.5 units of basal insulin per day.    Review of systems:  No nausea, vomiting, diarrhea, polyuria, or polydipsia.  Objective: Vital signs in last 24 hours: Temp:  [97.7 F (36.5 C)-98.2 F (36.8 C)] 97.7 F (36.5 C) (08/17 0500) Pulse Rate:  [59-82] 59  (08/17 0500) Resp:  [17-18] 18  (08/17 0500) BP: (105-112)/(69-75) 105/74 mmHg (08/17 0500) SpO2:  [98 %-99 %] 99 % (08/17 0500) Weight change:  Last BM Date: 02/29/12  Intake/Output from previous day: 08/16 0701 - 08/17 0700 In: 800 [P.O.:800] Out: -    General: No apparent distress. Eyes: Anicteric, pupils equal and round. Neck: Supple, trachea midline. Cardiovascular: Regular rhythm and rate, no murmur. Respiratory: Normal respiratory effort, clear to auscultation. Gastrointestinal: Normal pitch active bowel sounds, nontender abdomen without distention or appreciable hepatomegaly. Neurologic: Cranial nerves normal as tested. Musculoskeletal: Normal muscle tone, no muscle atrophy. Skin: Appropriate warmth, no visible rash. Mental status: Alert, conversant, speech clear, thought logical, appropriate mood and affect, no hallucinations or delusions evident.   Lab Results: CBG (last 3)   Basename 03/05/12 2054 03/05/12 1629 03/05/12 1131  GLUCAP 220* 169* 172*    Medications: I have reviewed the patient's current medications.  Assessment/Plan: 1.  Type 1 diabetes mellitus, uncontrolled but  improving glycemic control.   2.  Diabetic ketoacidosis, resolved.  His glycemic control has been reasonable during the past 24 hours.    Mr. Springs agreed to the following insulin regimen upon discharge from the hospital today: Lantus SoloSTAR 15 units subcutaneous once a day at bedtime. NovoLog Flexpen 1 unit for every 10 grams of carbohydrates at meals plus 1 extra unit for every 30 mg/dL of blood glucose above target of 100 mg/dL.    This regimen at home will provide more NovoLog insulin at meals than what he has received in the hospital over the past 24 hours.  Burgess Estelle, his wife visited their outpatient pharmacy and obtained the insulin pens (and pen needles) which I prescribed.    He will visit me and Cristy Folks, RN BSN CDE on Monday, March 08, 2012.  Thank you to everyone on his care team.     LOS: 5 days   Khilee Hendricksen 03/06/2012, 7:18 AM

## 2012-03-06 NOTE — Plan of Care (Signed)
Problem: Discharge Progression Outcomes Goal: Obtain signed CBG meter Rx form Outcome: Not Applicable Date Met:  03/06/12 Pt has own meter at home

## 2012-03-06 NOTE — Discharge Summary (Signed)
Physician Discharge Summary  Patient ID: Jordan Waller MRN: [id number] DOB/AGE: September 01, 1955 56 y.o.  Admit date: 03/01/2012 Discharge date: 03/06/2012  Primary Care Physician:  No primary provider on file.  Discharge Diagnoses:    .DKA (diabetic ketoacidoses) complete metabolic acidosis resolved  . type 1 diabetes mellitus, uncontrolled .Nausea & vomiting resolved  .Diarrhea resolved  .NSTEMI (non-ST elevated myocardial infarction) status post cardiac cath and DESx1  Consults:  [facility], Dr. Clifton James                    Patient was admitted by critical care service on 03/01/2012, [facility] assumed care from 03/05/2012                    Endocrinology, Dr. Talmage Coin   Discharge Medications: Medication List  As of 03/06/2012  9:23 AM   STOP taking these medications         ibuprofen 200 MG tablet      insulin pump 100 unit/ml Soln         TAKE these medications         aspirin 81 MG EC tablet   Take 1 tablet (81 mg total) by mouth daily.      colchicine 0.6 MG tablet   Take 1 tablet (0.6 mg total) by mouth daily.      doxycycline 100 MG tablet   Commonly known as: VIBRA-TABS   Take 1 tablet (100 mg total) by mouth every 12 (twelve) hours.      insulin aspart 100 UNIT/ML injection   Commonly known as: novoLOG   Inject 4-13 Units into the skin 3 (three) times daily with meals. Sliding scale: If blood glucose is 70 to 130: take 4 units at beginning of meal.  CBG 131 to 160: 5 units.   161 to 190: 6 units.   191 to 220: 7 units.   221 to 250: 8 units.   251 to 280:  9 units.   281 to 310: then 10 units.   311 to 340: 11 units.   341 to 370: 12 units.   371 to 400: 13 units.   Over 400 mg/dL: 13 units and notify your MD      insulin glargine 100 UNIT/ML injection   Commonly known as: LANTUS   Inject 15 Units into the skin at bedtime.      metoprolol tartrate 25 MG tablet   Commonly known as: LOPRESSOR   Take 1 tablet (25 mg total) by mouth 2 (two) times daily.      pantoprazole 40 MG tablet   Commonly known as: PROTONIX   Take 1 tablet (40 mg total) by mouth daily at 12 noon.      prasugrel 10 MG Tabs   Commonly known as: EFFIENT   Take 1 tablet (10 mg total) by mouth daily.      rosuvastatin 10 MG tablet   Commonly known as: CRESTOR   Take 10 mg by mouth daily.             Brief H and P: For complete details please refer to admission H and P, but in brief patient is a 56 year old male with history of diabetes on insulin pump, hyperlipidemia presented [facility]01[facility]2012 with 3-4 days history of vomiting, diarrhea, generalized pain, headache and elevated blood sugars. 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 Patient also reported significant drinking at the conference. Workup demonstrated pH of 7.0/ pCO2 13 / HCO3 3.5, K 5.6, creatinine 1.20, lipase 810, lactate 1.8 and moderate acetone in blood. Patient was transferred to [facility] for further management.   Hospital Course:  Please refer to brief H&P above, the patient was admitted to [facility] hospital for treatment of DKA, metabolic acidosis and received aggressive IV fluid resuscitation and IV insulin. Patient was noted to have a lipase of 810 at the time of admission. As a part of workup, troponins were drawn which showed trend of 1.5 to 4.2 and patient was also noted to be hypotensive. He was admitted to the intensive care unit. DKA (diabetic ketoacidoses) with type 1 diabetes: Resolved, patient was placed on DKA protocol, with aggressive IV fluid resuscitation and insulin drip. Insulin pump was shut off. Patient was managed in intensive care unit by critical care service. He was transferred to regular telemetry floor on 03/04/2012. Endocrinology was consulted and patient was closely followed by Dr. Talmage Coin. Currently patient's the blood sugars have  significantly improved on Lantus 15 units with sliding scale insulin as recommended by endocrinology.  Nausea & vomiting, diarrhea : resolved, likely secondary to DKA, viral GI illness/gastroentertis and mild acute pancreatitis resolved, lipase 810 on 8/12 improved to 63 on 03/03/2012, patient is tolerating solid diet   Diarrhea: Resolved   NSTEMI (non-ST elevated myocardial infarction):  Status post cardiac cath on 03/04/2012 showed severe 99% mid LAD stenosis, LVEF 40-45% with hypokinesis of anterior wall and apex, status post and DES x1 mid LAD. Continue aspirin and effient for atleast 1 year   Day of Discharge BP 105/74  Pulse 59  Temp 97.7 F (36.5 C) (Oral)  Resp 18  Ht 5\' 7"  (1.702 m)  Wt 68.7 kg (151 lb 7.3 oz)  BMI 23.72 kg/m2  SpO2 99%  Physical Exam: General: Alert and awake oriented x3 not in any acute distress. HEENT: anicteric sclera, pupils reactive to light and accommodation CVS: S1-S2 clear no murmur rubs or gallops Chest: clear to auscultation bilaterally, no wheezing rales or rhonchi Abdomen: soft nontender, nondistended, normal bowel sounds, no organomegaly Extremities: no cyanosis, clubbing or edema noted bilaterally Neuro: Cranial nerves II-XII intact, no focal neurological deficits   The results of significant diagnostics from this hospitalization (including imaging, microbiology, ancillary and laboratory) are listed below for reference.    LAB RESULTS: Basic Metabolic Panel:  Lab 03/05/12 1610 03/04/12 1505  NA 138 134*  K 4.2 4.0  CL 100 96  CO2 28 28  GLUCOSE 197* 229*  BUN 11 7  CREATININE 0.76 0.84  CALCIUM 9.1 8.9  MG 2.0 --  PHOS 3.1 --   Liver Function Tests:  Lab 03/03/12 0930 03/01/12 1010  AST 22 14  ALT 10 18  ALKPHOS 55 112  BILITOT 0.8 0.5  PROT 5.2* 8.7*  ALBUMIN 2.8* 5.5*    Lab 03/03/12 0450 03/02/12 0213  LIPASE 63* 223*  AMYLASE -- --   No results found for this basename: AMMONIA:2 in the last 168  hours CBC:  Lab 03/05/12 0510 03/04/12 0415 03/01/12 1010  WBC 8.3 7.7 --  NEUTROABS -- -- 17.3*  HGB 14.2 12.9* --  HCT 40.0 35.5* --  MCV 89.7 -- --  PLT 182 159 --   Cardiac Enzymes:  Lab 03/04/12 0415 03/03/12 2214  CKTOTAL 70 74  CKMB 4.0 4.8*  CKMBINDEX -- --  TROPONINI 2.49* 3.97*   BNP: No components  found with this basename: POCBNP:2 CBG:  Lab 03/06/12 0747 03/05/12 2054  GLUCAP 195* 220*    Significant Diagnostic Studies:  Dg Chest 2 View  03/01/2012  *RADIOLOGY REPORT*  Clinical Data: Vomiting, dehydration, history of diabetes  CHEST - 2 VIEW  Comparison: None.  Findings: Normal cardiac silhouette and mediastinal contours.  No focal parenchymal masses.  No pleural effusion or pneumothorax.  No acute osseous abnormalities.  IMPRESSION: No acute cardiopulmonary disease.  Original Report Authenticated By: Waynard Reeds, M.D.     Disposition and Follow-up: Discharge Orders    Future Orders Please Complete By Expires   Amb Referral to Cardiac Rehabilitation      Diet Carb Modified      Increase activity slowly          DISPOSITION: home  DIET: carb modified diet  ACTIVITY: as tolerated   DISCHARGE FOLLOW-UP Follow-up Information    Follow up with MCALHANY,CHRISTOPHER, MD. Schedule an appointment as soon as possible for a visit in 3 weeks. (for hospital follow-up)    Contact information:   [facility] [address] [place] [address] [phone]       Follow up with Talmage Coin, MD on 03/08/2012. (at 9:45 am)    Contact information:   [address] [facility] [place] [address] [phone]          Time spent on Discharge: 45 mins  Signed:   RAI,RIPUDEEP M.D. [facility] 03/06/2012, 9:23 AM Pager: [phone]  If 7PM-7AM, please contact night-coverage [email or web address] Password [email or web address]

## 2012-03-08 LAB — CULTURE, BLOOD (ROUTINE X 2): Culture: NO GROWTH

## 2012-03-15 ENCOUNTER — Telehealth: Payer: Self-pay | Admitting: Cardiovascular Disease

## 2012-03-15 NOTE — Telephone Encounter (Signed)
Pt was given instructions on taking nitro, but never got an rx for it, pls call into [facility] [address],

## 2012-03-16 ENCOUNTER — Other Ambulatory Visit: Payer: Self-pay | Admitting: Cardiovascular Disease

## 2012-03-16 MED ORDER — NITROGLYCERIN 0.4 MG SL SUBL: 0.4000 mg | SUBLINGUAL_TABLET | SUBLINGUAL | Status: DC | PRN

## 2012-03-16 NOTE — Telephone Encounter (Signed)
Pharmacy called stated per the pt, Dr. Clifton James was to call in nitroglycerin.  Spoke with Dr. Dr. Clifton James he stated he had seen the pt in the hospital and the pt does have a follow and that it was ok to give 25 tablets with 1 refill.  Caralee Ates, CMA

## 2012-04-02 ENCOUNTER — Ambulatory Visit (INDEPENDENT_AMBULATORY_CARE_PROVIDER_SITE_OTHER): Payer: PRIVATE HEALTH INSURANCE | Admitting: Cardiovascular Disease

## 2012-04-02 ENCOUNTER — Encounter: Payer: Self-pay | Admitting: Cardiovascular Disease

## 2012-04-02 VITALS — BP 100/70 | HR 57 | Ht 67.0 in | Wt 162.0 lb

## 2012-04-02 DIAGNOSIS — I25810 Atherosclerosis of coronary artery bypass graft(s) without angina pectoris: Secondary | ICD-10-CM

## 2012-04-02 MED ORDER — METOPROLOL TARTRATE 25 MG PO TABS: 25.0000 mg | ORAL_TABLET | Freq: Two times a day (BID) | ORAL | Status: DC

## 2012-04-02 MED ORDER — PRASUGREL HCL 10 MG PO TABS: 10.0000 mg | ORAL_TABLET | Freq: Every day | ORAL | Status: DC

## 2012-04-02 NOTE — Patient Instructions (Addendum)
Your physician wants you to follow-up in: 4 months.  You will receive a reminder letter in the mail two months in advance. If you don't receive a letter, please call our office to schedule the follow-up appointment.  Your physician has requested that you have an echocardiogram. Echocardiography is a painless test that uses sound waves to create images of your heart. It provides your doctor with information about the size and shape of your heart and how well your heart's chambers and valves are working. This procedure takes approximately one hour. There are no restrictions for this procedure. To be done in 3 months.    Your physician has recommended you make the following change in your medication: Stop Colchicine. Stop Protonix

## 2012-04-02 NOTE — Progress Notes (Signed)
History of Present Illness: 56 yo white male  with history of CAD, DM on insulin pump, HLD, daily alcohol user, former smoker who had a recent hospitalization at [facility] and is here today for cardiac follow up. He presented to [facility] on 03/01/12  with weakness, nausea, vomiting, generalized pain, and elevated blood sugars starting 02/29/12. He was found to be in DKA. His troponin trended up. He was seen in consultation by Dr. Antoine Poche on 03/01/12. Cardiac cath on 03/04/12 showed a 99% mid LAD stenosis. This was treated with a drug eluting stent. He did well following the PCI. He was discharged home on ASA/Effient/beta blocker and statin.   He is here today for cardiac follow up.    Primary Care Physician: Daisy Floro  Last Lipid Profile:Lipid Panel     Component Value Date/Time   CHOL 108 03/03/2012 0500   TRIG 55 03/03/2012 0500   HDL 54 03/03/2012 0500   CHOLHDL 2.0 03/03/2012 0500   VLDL 11 03/03/2012 0500   LDLCALC 43 03/03/2012 0500     Past Medical History  Diagnosis Date  . Diabetes mellitus   . High cholesterol     Past Surgical History  Procedure Date  . Rotator cuff repair     Current Outpatient Prescriptions  Medication Sig Dispense Refill  . aspirin EC 81 MG EC tablet Take 1 tablet (81 mg total) by mouth daily.  60 tablet  3  . colchicine 0.6 MG tablet Take 1 tablet (0.6 mg total) by mouth daily.  30 tablet  3  . insulin aspart (NOVOLOG) 100 UNIT/ML injection Inject 4-13 Units into the skin 3 (three) times daily with meals. Sliding scale: If blood glucose is 70 to 130: take 4 units at beginning of meal.  CBG 131 to 160: 5 units.   161 to 190: 6 units.   191 to 220: 7 units.   221 to 250: 8 units.   251 to 280:  9 units.   281 to 310: then 10 units.   311 to 340: 11 units.   341 to 370: 12 units.   371 to 400: 13 units.   Over 400 mg/dL: 13 units and notify your MD  5 pen  3  . insulin glargine (LANTUS) 100 UNIT/ML injection Inject 15 Units into the  skin at bedtime.  1 pen  0  . metoprolol tartrate (LOPRESSOR) 25 MG tablet Take 1 tablet (25 mg total) by mouth 2 (two) times daily.  60 tablet  3  . nitroGLYCERIN (NITROSTAT) 0.4 MG SL tablet Place 1 tablet (0.4 mg total) under the tongue every 5 (five) minutes as needed for chest pain.  25 tablet  1  . pantoprazole (PROTONIX) 40 MG tablet Take 1 tablet (40 mg total) by mouth daily at 12 noon.  30 tablet  3  . prasugrel (EFFIENT) 10 MG TABS Take 1 tablet (10 mg total) by mouth daily.  30 tablet  6  . rosuvastatin (CRESTOR) 10 MG tablet Take 10 mg by mouth daily.        No Known Allergies  History   Social History  . Marital Status: Married    Spouse Name: N/A    Number of Children: N/A  . Years of Education: N/A   Occupational History  . Not on file.   Social History Main Topics  . Smoking status: Former Smoker -- 20 years  . Smokeless tobacco: Not on file  . Alcohol Use: Yes  2-4 drinks per day  . Drug Use: No  . Sexually Active: Not on file   Other Topics Concern  . Not on file   Social History Narrative  . No narrative on file    Family History  Problem Relation Age of Onset  . Colon cancer Mother   . Diabetes type II Mother   . Colon cancer Sister   . Coronary artery disease Neg Hx     No first degree relatives with CAD    Review of Systems:  As stated in the HPI and otherwise negative.   BP 100/70  Pulse 57  Ht 5\' 7"  (1.702 m)  Wt 162 lb (73.483 kg)  BMI 25.37 kg/m2  Physical Examination: General: Well developed, well nourished, NAD HEENT: OP clear, mucus membranes moist SKIN: warm, dry. No rashes. Neuro: No focal deficits Musculoskeletal: Muscle strength 5/5 all ext Psychiatric: Mood and affect normal Neck: No JVD, no carotid bruits, no thyromegaly, no lymphadenopathy. Lungs:Clear bilaterally, no wheezes, rhonci, crackles Cardiovascular: Regular rate and rhythm. No murmurs, gallops or rubs. Abdomen:Soft. Bowel sounds present. Non-tender.    Extremities: No lower extremity edema. Pulses are 2 + in the bilateral DP/PT.  Cardiac cath 03/04/12:  Left main: No disease.  Left Anterior Descending Artery: Large caliber vessel that courses to the apex. The ostium has 25% stenosis. The proximal vessel has luminal irregularities. The mid vessel has a 99% stenosis. The distal vessel has no obstructive disease. There is a moderate sized diagonal branch with no disease noted.  Circumflex Artery: Moderate sized vessel with moderate sized first obtuse marginal branch. The AV groove Circumflex is moderate sized beyond the OM branch. No disease noted.  Right Coronary Artery: Large, dominant vessel with mild luminal irregularities in the proximal/mid and distal vessel. The PDA has 20% ostial stenosis.  Left Ventricular Angiogram: LVEF 40-45% with hypokinesis of the anterior wall and apex.  Impression:  1. Single vessel CAD, now s/p successful PTCA/DES x 1 mid LAD  2. Segmental LV systolic dysfunction  3. NSTEMI  Echo 03/03/12:  Left ventricle: Distal septal and apical hypokinesis The cavity size was normal. Wall thickness was normal. Systolic function was normal. The estimated ejection fraction was in the range of 50% to 55%. - Atrial septum: No defect or patent foramen ovale was identified.   Assessment and Plan: 1. CAD: s/p recent DES x 1 mid LAD during presentation for NSTEMI in setting of DKA in August 2013. He is doing well. He will need dual antiplatelet therapy with ASA and Effient for at least one year. Continue beta blocker and statin. His LV function is low normal. Repeat echo 3 months. Will not add Ace-inh at this time with relative hypotension. Resume exercise.

## 2012-07-06 ENCOUNTER — Other Ambulatory Visit (HOSPITAL_COMMUNITY): Payer: PRIVATE HEALTH INSURANCE

## 2012-08-02 ENCOUNTER — Encounter: Payer: Self-pay | Admitting: Cardiovascular Disease

## 2012-08-02 ENCOUNTER — Ambulatory Visit (INDEPENDENT_AMBULATORY_CARE_PROVIDER_SITE_OTHER): Payer: PRIVATE HEALTH INSURANCE | Admitting: Cardiovascular Disease

## 2012-08-02 VITALS — BP 106/78 | HR 58 | Ht 67.0 in | Wt 165.0 lb

## 2012-08-02 DIAGNOSIS — I2510 Atherosclerotic heart disease of native coronary artery without angina pectoris: Secondary | ICD-10-CM

## 2012-08-02 NOTE — Patient Instructions (Addendum)

## 2012-08-02 NOTE — Progress Notes (Signed)
History of Present Illness: 57 yo white male with history of CAD, DM on insulin pump, HLD, daily alcohol user, former smoker who had a recent hospitalization at [facility] and is here today for cardiac follow up. He presented to [facility] on 03/01/12 with weakness, nausea, vomiting, generalized pain, and elevated blood sugars starting 02/29/12. He was found to be in DKA. His troponin trended up. He was seen in consultation by Dr. Antoine Poche on 03/01/12. Cardiac cath on 03/04/12 showed a 99% mid LAD stenosis. This was treated with a drug eluting stent. He did well following the PCI. He was discharged home on ASA/Effient/beta blocker and statin.   He is here today for cardiac follow up. He has been feeling well. He has had no chest pain or SOB. He does not exercise. He travels often for work. He does not smoke. He has been tolerating all medications. He has been trying to control his blood sugars. He had an issue with his insulin pump in December and had DKA.   Primary Care Physician: Gildardo Cranker  Last Lipid Profile:Lipid Panel     Component Value Date/Time   CHOL 108 03/03/2012 0500   TRIG 55 03/03/2012 0500   HDL 54 03/03/2012 0500   CHOLHDL 2.0 03/03/2012 0500   VLDL 11 03/03/2012 0500   LDLCALC 43 03/03/2012 0500     Past Medical History  Diagnosis Date  . Diabetes mellitus   . High cholesterol     Past Surgical History  Procedure Date  . Rotator cuff repair     Current Outpatient Prescriptions  Medication Sig Dispense Refill  . aspirin EC 81 MG EC tablet Take 1 tablet (81 mg total) by mouth daily.  60 tablet  3  . BD PEN NEEDLE NANO U/F 32G X 4 MM MISC as directed.      . doxycycline (VIBRA-TABS) 100 MG tablet Twice daily.      . insulin aspart (NOVOLOG) 100 UNIT/ML injection Inject 4-13 Units into the skin 3 (three) times daily with meals. Sliding scale: If blood glucose is 70 to 130: take 4 units at beginning of meal.  CBG 131 to 160: 5 units.   161 to 190: 6 units.   191  to 220: 7 units.   221 to 250: 8 units.   251 to 280:  9 units.   281 to 310: then 10 units.   311 to 340: 11 units.   341 to 370: 12 units.   371 to 400: 13 units.   Over 400 mg/dL: 13 units and notify your MD  5 pen  3  . insulin glargine (LANTUS) 100 UNIT/ML injection Inject 15 Units into the skin at bedtime.  1 pen  0  . metoprolol tartrate (LOPRESSOR) 25 MG tablet Take 1 tablet (25 mg total) by mouth 2 (two) times daily.  60 tablet  6  . nitroGLYCERIN (NITROSTAT) 0.4 MG SL tablet Place 1 tablet (0.4 mg total) under the tongue every 5 (five) minutes as needed for chest pain.  25 tablet  1  . prasugrel (EFFIENT) 10 MG TABS Take 1 tablet (10 mg total) by mouth daily.  30 tablet  6  . rosuvastatin (CRESTOR) 10 MG tablet Take 10 mg by mouth daily.        No Known Allergies  History   Social History  . Marital Status: Married    Spouse Name: N/A    Number of Children: N/A  . Years of Education:  N/A   Occupational History  . Not on file.   Social History Main Topics  . Smoking status: Former Smoker -- 20 years  . Smokeless tobacco: Not on file  . Alcohol Use: Yes     Comment: 2-4 drinks per day  . Drug Use: No  . Sexually Active: Not on file   Other Topics Concern  . Not on file   Social History Narrative  . No narrative on file    Family History  Problem Relation Age of Onset  . Colon cancer Mother   . Diabetes type II Mother   . Colon cancer Sister   . Coronary artery disease Neg Hx     No first degree relatives with CAD    Review of Systems:  As stated in the HPI and otherwise negative.   BP 106/78  Pulse 58  Ht 5\' 7"  (1.702 m)  Wt 165 lb (74.844 kg)  BMI 25.84 kg/m2  SpO2 99%  Physical Examination: General: Well developed, well nourished, NAD HEENT: OP clear, mucus membranes moist SKIN: warm, dry. No rashes. Neuro: No focal deficits Musculoskeletal: Muscle strength 5/5 all ext Psychiatric: Mood and affect normal Neck: No JVD, no carotid bruits, no  thyromegaly, no lymphadenopathy. Lungs:Clear bilaterally, no wheezes, rhonci, crackles Cardiovascular: Regular rate and rhythm. No murmurs, gallops or rubs. Abdomen:Soft. Bowel sounds present. Non-tender.  Extremities: No lower extremity edema. Pulses are 2 + in the bilateral DP/PT.  EKG:  Cardiac cath 03/04/12:  Left main: No disease.  Left Anterior Descending Artery: Large caliber vessel that courses to the apex. The ostium has 25% stenosis. The proximal vessel has luminal irregularities. The mid vessel has a 99% stenosis. The distal vessel has no obstructive disease. There is a moderate sized diagonal branch with no disease noted.  Circumflex Artery: Moderate sized vessel with moderate sized first obtuse marginal branch. The AV groove Circumflex is moderate sized beyond the OM branch. No disease noted.  Right Coronary Artery: Large, dominant vessel with mild luminal irregularities in the proximal/mid and distal vessel. The PDA has 20% ostial stenosis.  Left Ventricular Angiogram: LVEF 40-45% with hypokinesis of the anterior wall and apex.  Impression:  1. Single vessel CAD, now s/p successful PTCA/DES x 1 mid LAD  2. Segmental LV systolic dysfunction  3. NSTEMI   Echo 03/03/12:  Left ventricle: Distal septal and apical hypokinesis The cavity size was normal. Wall thickness was normal. Systolic function was normal. The estimated ejection fraction was in the range of 50% to 55%. - Atrial septum: No defect or patent foramen ovale was identified.  Assessment and Plan:  1. CAD: s/p  DES x 1 mid LAD during presentation for NSTEMI in setting of DKA in August 2013. He is doing well. He will need dual antiplatelet therapy with ASA and Effient for at least one year. Continue beta blocker and statin. His LV function is low normal. Will need repeat echo.  Will not add Ace-inh at this time with relative hypotension.

## 2012-08-12 ENCOUNTER — Encounter: Payer: Self-pay | Admitting: Cardiovascular Disease

## 2012-08-12 NOTE — Telephone Encounter (Signed)
error 

## 2012-09-06 ENCOUNTER — Ambulatory Visit (HOSPITAL_COMMUNITY): Payer: PRIVATE HEALTH INSURANCE | Attending: Internal Medicine | Admitting: Radiology

## 2012-09-06 DIAGNOSIS — E785 Hyperlipidemia, unspecified: Secondary | ICD-10-CM | POA: Insufficient documentation

## 2012-09-06 DIAGNOSIS — I25810 Atherosclerosis of coronary artery bypass graft(s) without angina pectoris: Secondary | ICD-10-CM

## 2012-09-06 DIAGNOSIS — I2510 Atherosclerotic heart disease of native coronary artery without angina pectoris: Secondary | ICD-10-CM

## 2012-09-06 DIAGNOSIS — I079 Rheumatic tricuspid valve disease, unspecified: Secondary | ICD-10-CM | POA: Insufficient documentation

## 2012-09-06 DIAGNOSIS — E119 Type 2 diabetes mellitus without complications: Secondary | ICD-10-CM | POA: Insufficient documentation

## 2012-09-06 NOTE — Progress Notes (Signed)
Echocardiogram performed.  

## 2012-09-08 ENCOUNTER — Telehealth: Payer: Self-pay | Admitting: Cardiovascular Disease

## 2012-09-08 NOTE — Telephone Encounter (Signed)
See phone noted dated Feb. 19, 2014 for documentation regarding this call

## 2012-09-08 NOTE — Telephone Encounter (Signed)
Follow up call   Returning call back to nurse  

## 2012-09-08 NOTE — Telephone Encounter (Signed)
New Problem:    Patient called in returning your call about his recent ECHO results.  Please call back.

## 2012-09-08 NOTE — Telephone Encounter (Signed)
Left message to call back  

## 2012-09-08 NOTE — Telephone Encounter (Signed)
Spoke with pt and reviewed echo results with him.  

## 2012-11-01 ENCOUNTER — Other Ambulatory Visit: Payer: Self-pay | Admitting: Cardiovascular Disease

## 2012-11-02 ENCOUNTER — Other Ambulatory Visit: Payer: Self-pay

## 2013-03-23 ENCOUNTER — Encounter: Payer: Self-pay | Admitting: Cardiovascular Disease

## 2013-03-23 ENCOUNTER — Ambulatory Visit (INDEPENDENT_AMBULATORY_CARE_PROVIDER_SITE_OTHER): Payer: PRIVATE HEALTH INSURANCE | Admitting: Cardiovascular Disease

## 2013-03-23 VITALS — BP 111/72 | HR 71 | Ht 67.0 in | Wt 158.0 lb

## 2013-03-23 DIAGNOSIS — I2510 Atherosclerotic heart disease of native coronary artery without angina pectoris: Secondary | ICD-10-CM

## 2013-03-23 LAB — HEPATIC FUNCTION PANEL
ALT: 38 U/L (ref 0–53)
AST: 27 U/L (ref 0–37)
Albumin: 4.4 g/dL (ref 3.5–5.2)
Total Bilirubin: 0.9 mg/dL (ref 0.3–1.2)
Total Protein: 7.1 g/dL (ref 6.0–8.3)

## 2013-03-23 LAB — LIPID PANEL
Cholesterol: 157 mg/dL (ref 0–200)
HDL: 59.9 mg/dL (ref >39.00)
Triglycerides: 115 mg/dL (ref 0.0–149.0)
VLDL: 23 mg/dL (ref 0.0–40.0)

## 2013-03-23 NOTE — Progress Notes (Signed)
History of Present Illness: 57 yo white male with history of CAD, DM on insulin pump, HLD, daily alcohol user, former smoker who is here today for cardiac follow up. He presented to [facility] on 03/01/12 with weakness, nausea, vomiting, generalized pain, and elevated blood sugars starting 02/29/12. He was found to be in DKA. His troponin trended up. Cardiac cath on 03/04/12 showed a 99% mid LAD stenosis. This was treated with a 3.0 x 16 mm Promus Element DES.    He is here today for cardiac follow up. He has been feeling well. He has had no chest pain or SOB. He has been exercising every day. He travels often for work. He does not smoke. He has been tolerating all medications.   Primary Care Physician: Gildardo Cranker  Last Lipid Profile:Lipid Panel     Component Value Date/Time   CHOL 108 03/03/2012 0500   TRIG 55 03/03/2012 0500   HDL 54 03/03/2012 0500   CHOLHDL 2.0 03/03/2012 0500   VLDL 11 03/03/2012 0500   LDLCALC 43 03/03/2012 0500    Past Medical History  Diagnosis Date  . Diabetes mellitus   . High cholesterol   . CAD (coronary artery disease)     NSTEMI August 2013, DES mid LAD    Past Surgical History  Procedure Laterality Date  . Rotator cuff repair    . Tonsillectomy and adenoidectomy      Current Outpatient Prescriptions  Medication Sig Dispense Refill  . BD PEN NEEDLE NANO U/F 32G X 4 MM MISC as directed.      . doxycycline (VIBRA-TABS) 100 MG tablet Take 100 mg by mouth 2 (two) times daily.       Marland Kitchen EFFIENT 10 MG TABS TAKE 1 TABLET EVERY DAY  30 tablet  6  . insulin glargine (LANTUS) 100 UNIT/ML injection Inject 15 Units into the skin at bedtime.      . Insulin Pen Needle 32G X 4 MM MISC by Does not apply route.      . metoprolol tartrate (LOPRESSOR) 25 MG tablet Take 1 tablet (25 mg total) by mouth 2 (two) times daily.  60 tablet  6  . nitroGLYCERIN (NITROSTAT) 0.4 MG SL tablet Place 0.4 mg under the tongue every 5 (five) minutes as needed for chest pain. Up  to 3 times then call 911      . rosuvastatin (CRESTOR) 10 MG tablet Take 10 mg by mouth daily.      . insulin aspart (NOVOLOG) 100 UNIT/ML injection Inject 4-13 Units into the skin 3 (three) times daily with meals. Sliding scale: If blood glucose is 70 to 130: take 4 units at beginning of meal.  CBG 131 to 160: 5 units.   161 to 190: 6 units.   191 to 220: 7 units.   221 to 250: 8 units.   251 to 280:  9 units.   281 to 310: then 10 units.   311 to 340: 11 units.   341 to 370: 12 units.   371 to 400: 13 units.   Over 400 mg/dL: 13 units and notify your MD  5 pen  3   No current facility-administered medications for this visit.    No Known Allergies  History   Social History  . Marital Status: Married    Spouse Name: N/A    Number of Children: 3  . 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 Smoking status: Former Smoker -- 0.50 packs/day for 20 years  . Smokeless tobacco: Not on file  . Alcohol Use: 0.5 oz/week    1 drink(s) per week     Comment: 2-4 drinks per day  . Drug Use: No  . Sexual Activity: Not on file   Other Topics Concern  . Not on file   Social History Narrative  . No narrative on file    Family History  Problem Relation Age of Onset  . Colon cancer Mother   . Diabetes type II Mother   . Colon cancer Sister   . Coronary artery disease Neg Hx     No first degree relatives with CAD    Review of Systems:  As stated in the HPI and otherwise negative.   BP 111/72  Pulse 71  Ht 5\' 7"  (1.702 m)  Wt 158 lb (71.668 kg)  BMI 24.74 kg/m2  Physical Examination: General: Well developed, well nourished, NAD HEENT: OP clear, mucus membranes moist SKIN: warm, dry. No rashes. Neuro: No focal deficits Musculoskeletal: Muscle strength 5/5 all ext Psychiatric: Mood and affect normal Neck: No JVD, no carotid bruits, no thyromegaly, no lymphadenopathy. Lungs:Clear bilaterally, no wheezes, rhonci,  crackles Cardiovascular: Regular rate and rhythm. No murmurs, gallops or rubs. Abdomen:Soft. Bowel sounds present. Non-tender.  Extremities: No lower extremity edema. Pulses are 2 + in the bilateral DP/PT.  Cardiac cath 03/04/12:  Left main: No disease.  Left Anterior Descending Artery: Large caliber vessel that courses to the apex. The ostium has 25% stenosis. The proximal vessel has luminal irregularities. The mid vessel has a 99% stenosis. The distal vessel has no obstructive disease. There is a moderate sized diagonal branch with no disease noted.  Circumflex Artery: Moderate sized vessel with moderate sized first obtuse marginal branch. The AV groove Circumflex is moderate sized beyond the OM branch. No disease noted.  Right Coronary Artery: Large, dominant vessel with mild luminal irregularities in the proximal/mid and distal vessel. The PDA has 20% ostial stenosis.  Left Ventricular Angiogram: LVEF 40-45% with hypokinesis of the anterior wall and apex.  Impression:  1. Single vessel CAD, now s/p successful PTCA/DES x 1 mid LAD  2. Segmental LV systolic dysfunction  3. NSTEMI   Echo 09/06/12: Left ventricle: The cavity size was normal. Wall thickness was normal. Systolic function was normal. The estimated ejection fraction was in the range of 60% to 65%. Doppler parameters are consistent with abnormal left ventricular relaxation (grade 1 diastolic dysfunction). - Mitral valve: Calcified annulus. Mildly thickened leaflets  Assessment and Plan:   1. CAD: s/p DES x 1 mid LAD during presentation for NSTEMI in setting of DKA in August 2013. He is doing well.  Will continue ASA, beta blocker and statin. Will d/c Effient.  His LV function is normal by echo 09/06/12. He has not been on an Ace-inhibitor due to hypotension. Repeat lipids and LFTs today. Continue daily exercise and heart healthy diet.

## 2013-03-23 NOTE — Patient Instructions (Addendum)
Your physician wants you to follow-up in:  6 months. You will receive a reminder letter in the mail two months in advance. If you don't receive a letter, please call our office to schedule the follow-up appointment.  Your physician has recommended you make the following change in your medication: Stop Effient.

## 2013-07-09 ENCOUNTER — Telehealth: Payer: Self-pay | Admitting: Physician Assistant

## 2013-07-09 MED ORDER — METOPROLOL TARTRATE 25 MG PO TABS: 25.0000 mg | ORAL_TABLET | Freq: Two times a day (BID) | ORAL | Status: DC

## 2013-07-09 NOTE — Telephone Encounter (Signed)
Pt called on Saturday regarding refill request. He did not realize he was completely out as of today. He's going to [place] soon to visit his daughter. Last OV 03/2013 with Dr. Clifton James recommended to continue beta blocker. I offered to send in RX to [facility] as requested. I e-scribed metoprolol tartrate 25mg  BID disp #60 with 6 refills. He verbalized understanding and gratitude. Antino Mayabb PA-C

## 2013-09-12 ENCOUNTER — Other Ambulatory Visit: Payer: Self-pay | Admitting: Cardiovascular Disease

## 2013-10-17 ENCOUNTER — Other Ambulatory Visit: Payer: Self-pay | Admitting: Cardiovascular Disease

## 2013-11-16 ENCOUNTER — Other Ambulatory Visit: Payer: Self-pay | Admitting: Cardiovascular Disease

## 2013-11-25 ENCOUNTER — Other Ambulatory Visit: Payer: Self-pay | Admitting: *Deleted

## 2013-11-25 MED ORDER — ROSUVASTATIN CALCIUM 10 MG PO TABS: ORAL_TABLET | ORAL | Status: DC

## 2013-12-22 ENCOUNTER — Encounter: Payer: Self-pay | Admitting: Cardiovascular Disease

## 2013-12-22 ENCOUNTER — Ambulatory Visit (INDEPENDENT_AMBULATORY_CARE_PROVIDER_SITE_OTHER): Payer: PRIVATE HEALTH INSURANCE | Admitting: Cardiovascular Disease

## 2013-12-22 ENCOUNTER — Encounter (INDEPENDENT_AMBULATORY_CARE_PROVIDER_SITE_OTHER): Payer: Self-pay

## 2013-12-22 VITALS — BP 106/60 | HR 64 | Ht 67.0 in | Wt 159.0 lb

## 2013-12-22 DIAGNOSIS — I2510 Atherosclerotic heart disease of native coronary artery without angina pectoris: Secondary | ICD-10-CM

## 2013-12-22 MED ORDER — ROSUVASTATIN CALCIUM 10 MG PO TABS: 10.0000 mg | ORAL_TABLET | ORAL | Status: DC

## 2013-12-22 MED ORDER — CLOPIDOGREL BISULFATE 75 MG PO TABS: 75.0000 mg | ORAL_TABLET | Freq: Every day | ORAL | Status: DC

## 2013-12-22 NOTE — Progress Notes (Signed)
History of Present Illness: 58 yo white male with history of CAD, DM on insulin pump, HLD,  former smoker who is here today for cardiac follow up. He presented to [place] on 03/01/12 with weakness, nausea, vomiting, generalized pain, and elevated blood sugars starting 02/29/12. He was found to be in DKA. His troponin trended up. Cardiac cath on 03/04/12 showed a 99% mid LAD stenosis. This was treated with a 3.0 x 16 mm Promus Element DES.    He is here today for cardiac follow up. He has been feeling well. He has had no chest pain or SOB. He has been exercising every day. He travels often for work. He does not smoke. He has been tolerating all medications. Occasional cramps in hands/arms.   Primary Care Physician: Lona Kettle  Last Lipid Profile:Lipid Panel     Component Value Date/Time   CHOL 157 03/23/2013 0935   TRIG 115.0 03/23/2013 0935   HDL 59.90 03/23/2013 0935   CHOLHDL 3 03/23/2013 0935   VLDL 23.0 03/23/2013 0935   [place] 74 03/23/2013 0935    Past Medical History  Diagnosis Date  . Diabetes mellitus   . High cholesterol   . CAD (coronary artery disease)     NSTEMI August 2013, DES mid LAD    Past Surgical History  Procedure Laterality Date  . Rotator cuff repair    . Tonsillectomy and adenoidectomy      Current Outpatient Prescriptions  Medication Sig Dispense Refill  . aspirin EC 81 MG tablet Take 81 mg by mouth daily.      . BD PEN NEEDLE NANO U/F 32G X 4 MM MISC as directed.      . doxycycline (VIBRA-TABS) 100 MG tablet Take 100 mg by mouth 2 (two) times daily.       . insulin glargine (LANTUS) 100 UNIT/ML injection Inject 15 Units into the skin at bedtime.      . Insulin Pen Needle 32G X 4 MM MISC by Does not apply route.      . metoprolol tartrate (LOPRESSOR) 25 MG tablet Take 1 tablet (25 mg total) by mouth 2 (two) times daily.  60 tablet  6  . nitroGLYCERIN (NITROSTAT) 0.4 MG SL tablet Place 0.4 mg under the tongue every 5 (five) minutes as needed for  chest pain. Up to 3 times then call 911      . rosuvastatin (CRESTOR) 10 MG tablet TAKE 1 TABLET BY MOUTH EVERY DAY  30 tablet  3  . insulin aspart (NOVOLOG) 100 UNIT/ML injection Inject 4-13 Units into the skin 3 (three) times daily with meals. Sliding scale: If blood glucose is 70 to 130: take 4 units at beginning of meal.  CBG 131 to 160: 5 units.   161 to 190: 6 units.   191 to 220: 7 units.   221 to 250: 8 units.   251 to 280:  9 units.   281 to 310: then 10 units.   311 to 340: 11 units.   341 to 370: 12 units.   371 to 400: 13 units.   Over 400 mg/dL: 13 units and notify your MD  5 pen  3   No current facility-administered medications for this visit.    No Known Allergies  History   Social History  . Marital Status: Married    Spouse Name: N/A    Number of Children: 3  . 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 Smoking status: Former Smoker -- 0.50 packs/day for 20 years  . Smokeless tobacco: Not on file  . Alcohol Use: 0.5 oz/week    1 drink(s) per week     Comment: 2-4 drinks per day  . Drug Use: No  . Sexual Activity: Not on file   Other Topics Concern  . Not on file   Social History Narrative  . No narrative on file    Family History  Problem Relation Age of Onset  . Colon cancer Mother   . Diabetes type II Mother   . Colon cancer Sister   . Coronary artery disease Neg Hx     No first degree relatives with CAD    Review of Systems:  As stated in the HPI and otherwise negative.   BP 106/60  Pulse 64  Ht 5\' 7"  (1.702 m)  Wt 159 lb (72.122 kg)  BMI 24.90 kg/m2  Physical Examination: General: Well developed, well nourished, NAD HEENT: OP clear, mucus membranes moist SKIN: warm, dry. No rashes. Neuro: No focal deficits Musculoskeletal: Muscle strength 5/5 all ext Psychiatric: Mood and affect normal Neck: No JVD, no carotid bruits, no thyromegaly, no lymphadenopathy. Lungs:Clear  bilaterally, no wheezes, rhonci, crackles Cardiovascular: Regular rate and rhythm. No murmurs, gallops or rubs. Abdomen:Soft. Bowel sounds present. Non-tender.  Extremities: No lower extremity edema. Pulses are 2 + in the bilateral DP/PT.  Cardiac cath 03/04/12:  Left main: No disease.  Left Anterior Descending Artery: Large caliber vessel that courses to the apex. The ostium has 25% stenosis. The proximal vessel has luminal irregularities. The mid vessel has a 99% stenosis. The distal vessel has no obstructive disease. There is a moderate sized diagonal branch with no disease noted.  Circumflex Artery: Moderate sized vessel with moderate sized first obtuse marginal branch. The AV groove Circumflex is moderate sized beyond the OM branch. No disease noted.  Right Coronary Artery: Large, dominant vessel with mild luminal irregularities in the proximal/mid and distal vessel. The PDA has 20% ostial stenosis.  Left Ventricular Angiogram: LVEF 40-45% with hypokinesis of the anterior wall and apex.  Impression:  1. Single vessel CAD, now s/p successful PTCA/DES x 1 mid LAD  2. Segmental LV systolic dysfunction  3. NSTEMI   Echo 09/06/12: Left ventricle: The cavity size was normal. Wall thickness was normal. Systolic function was normal. The estimated ejection fraction was in the range of 60% to 65%. Doppler parameters are consistent with abnormal left ventricular relaxation (grade 1 diastolic dysfunction). - Mitral valve: Calcified annulus. Mildly thickened leaflets  Assessment and Plan:   1. CAD: s/p DES x 1 mid LAD during presentation for NSTEMI in setting of DKA in August 2013. He is doing well.  Will continue ASA, beta blocker and statin. Will start Plavix 75 mg daily. His LV function is normal by echo 09/06/12. He has not been on an Ace-inhibitor due to hypotension. Continue daily exercise and heart healthy diet.   2. Muscle cramping: Will reduce Crestor to 10 mg every other day.

## 2013-12-22 NOTE — Patient Instructions (Signed)
Your physician wants you to follow-up in:  12 months. You will receive a reminder letter in the mail two months in advance. If you don't receive a letter, please call our office to schedule the follow-up appointment.  Your physician has recommended you make the following change in your medication:   Start Clopidogrel 75 mg by mouth daily. Decrease Crestor to 10 mg by mouth every other day

## 2014-04-07 ENCOUNTER — Ambulatory Visit: Payer: PRIVATE HEALTH INSURANCE | Admitting: Cardiovascular Disease

## 2014-04-08 ENCOUNTER — Other Ambulatory Visit: Payer: Self-pay | Admitting: Cardiovascular Disease

## 2014-06-29 ENCOUNTER — Encounter (HOSPITAL_COMMUNITY): Payer: Self-pay | Admitting: Cardiovascular Disease

## 2014-06-30 ENCOUNTER — Other Ambulatory Visit: Payer: Self-pay | Admitting: Cardiovascular Disease

## 2014-10-25 ENCOUNTER — Other Ambulatory Visit: Payer: Self-pay | Admitting: Cardiovascular Disease

## 2014-11-21 ENCOUNTER — Other Ambulatory Visit: Payer: Self-pay | Admitting: Cardiovascular Disease

## 2014-12-20 ENCOUNTER — Other Ambulatory Visit: Payer: Self-pay | Admitting: Cardiovascular Disease

## 2015-01-23 ENCOUNTER — Other Ambulatory Visit: Payer: Self-pay | Admitting: Cardiovascular Disease

## 2015-01-25 ENCOUNTER — Telehealth: Payer: Self-pay

## 2015-01-25 NOTE — Telephone Encounter (Signed)
Prior auth for gen Crestor 10mg  sent to [facility] via [place] [facility] [employer].

## 2015-01-30 ENCOUNTER — Telehealth: Payer: Self-pay

## 2015-01-30 NOTE — Telephone Encounter (Signed)
Spoke with [employer] and Rosuvastatin requires prior auth and this was denied.  Brand name Crestor will be covered at Tier 2 level.  I spoke with pt to see if he would like me to fill with brand name.  He is seeing Dr. Angelena Form on February 13, 2015. He has enough medication to last until this office visit.   He will check with his insurance about cost of Tier 2 medication and let me know at upcoming office visit if he wants to continue on brand name Crestor or talk with Dr. Angelena Form about being changed to another generic.

## 2015-01-30 NOTE — Telephone Encounter (Signed)
Coverage for Crestor 10mg  denied per [employer]. Will consullt with Dr. Angelena Form regarding an appeal.

## 2015-02-07 ENCOUNTER — Other Ambulatory Visit: Payer: Self-pay | Admitting: Cardiovascular Disease

## 2015-02-08 ENCOUNTER — Other Ambulatory Visit: Payer: Self-pay

## 2015-02-08 MED ORDER — CLOPIDOGREL BISULFATE 75 MG PO TABS: 75.0000 mg | ORAL_TABLET | Freq: Every day | ORAL | Status: DC

## 2015-02-12 ENCOUNTER — Ambulatory Visit (INDEPENDENT_AMBULATORY_CARE_PROVIDER_SITE_OTHER): Payer: BLUE CROSS/BLUE SHIELD | Admitting: Cardiovascular Disease

## 2015-02-12 ENCOUNTER — Other Ambulatory Visit: Payer: Self-pay | Admitting: *Deleted

## 2015-02-12 ENCOUNTER — Encounter: Payer: Self-pay | Admitting: Cardiovascular Disease

## 2015-02-12 VITALS — BP 118/80 | HR 59 | Ht 67.0 in | Wt 162.0 lb

## 2015-02-12 DIAGNOSIS — E785 Hyperlipidemia, unspecified: Secondary | ICD-10-CM

## 2015-02-12 DIAGNOSIS — I25810 Atherosclerosis of coronary artery bypass graft(s) without angina pectoris: Secondary | ICD-10-CM

## 2015-02-12 MED ORDER — ATORVASTATIN CALCIUM 20 MG PO TABS: 20.0000 mg | ORAL_TABLET | Freq: Every day | ORAL | Status: DC

## 2015-02-12 NOTE — Patient Instructions (Signed)
Medication Instructions:  Your physician has recommended you make the following change in your medication:  Stop Crestor. Start Atorvastatin 20 mg by mouth daily at bedtime   Labwork: Your physician recommends that you return for fasting lab work in: 12 weeks. Scheduled for May 08, 2015.  The lab opens at 7:30 AM   Testing/Procedures: none  Follow-Up: Your physician wants you to follow-up in: 12 months.  You will receive a reminder letter in the mail two months in advance. If you don't receive a letter, please call our office to schedule the follow-up appointment.   Any Other Special Instructions Will Be Listed Below (If Applicable).

## 2015-02-12 NOTE — Progress Notes (Signed)
Chief Complaint  Patient presents with  . Back Pain    History of Present Illness: 59 yo white male with history of CAD, DM on insulin pump, HLD,  former smoker who is here today for cardiac follow up. He presented to [facility] on 03/01/12 with weakness, nausea, vomiting, generalized pain, and elevated blood sugars starting 02/29/12. He was found to be in DKA. His troponin trended up. Cardiac cath on 03/04/12 showed a 99% mid LAD stenosis. This was treated with a 3.0 x 16 mm Promus Element DES.    He is here today for cardiac follow up. He has been feeling well. He has had no chest pain or SOB. He has been exercising every day. He has been tolerating all medications.   Primary Care Physician: Lona Kettle  Last Lipid Profile:Lipid Panel     Component Value Date/Time   CHOL 157 03/23/2013 0935   TRIG 115.0 03/23/2013 0935   HDL 59.90 03/23/2013 0935   CHOLHDL 3 03/23/2013 0935   VLDL 23.0 03/23/2013 0935   LDLCALC 74 03/23/2013 0935    Past Medical History  Diagnosis Date  . Diabetes mellitus   . High cholesterol   . CAD (coronary artery disease)     NSTEMI August 2013, DES mid LAD    Past Surgical History  Procedure Laterality Date  . Rotator cuff repair    . Tonsillectomy and adenoidectomy    . Left heart catheterization with coronary angiogram N/A 03/04/2012    Procedure: LEFT HEART CATHETERIZATION WITH CORONARY ANGIOGRAM;  Surgeon: Burnell Blanks, MD;  Location: [facility] CATH LAB;  Service: Cardiovascular;  Laterality: N/A;    Current Outpatient Prescriptions  Medication Sig Dispense Refill  . aspirin EC 81 MG tablet Take 81 mg by mouth daily.    . BD PEN NEEDLE NANO U/F 32G X 4 MM MISC as directed.    . clopidogrel (PLAVIX) 75 MG tablet Take 1 tablet (75 mg total) by mouth daily. 30 tablet 3  . doxycycline (VIBRA-TABS) 100 MG tablet Take 100 mg by mouth 2 (two) times daily.     . insulin glargine (LANTUS) 100 UNIT/ML injection Inject 15 Units into the  skin at bedtime.    . Insulin Pen Needle 32G X 4 MM MISC by Does not apply route.    Marland Kitchen lisinopril (PRINIVIL,ZESTRIL) 2.5 MG tablet Take 2.5 mg by mouth daily.  3  . metoprolol tartrate (LOPRESSOR) 25 MG tablet TAKE 1 TABLET BY MOUTH TWICE A DAY 60 tablet 5  . nitroGLYCERIN (NITROSTAT) 0.4 MG SL tablet Place 0.4 mg under the tongue every 5 (five) minutes as needed for chest pain. Up to 3 times then call 911    . NOVOLOG 100 UNIT/ML injection BASAL(UNITS/HOUR)12AM 0.9,CARB RATIO 10GRAMS/UNIT,SENSITIVITY 40MG /DL PER UNIT,TARGET 100MG /ML DAILY  5  . atorvastatin (LIPITOR) 20 MG tablet Take 1 tablet (20 mg total) by mouth daily. 30 tablet 11  . insulin aspart (NOVOLOG) 100 UNIT/ML injection Inject 4-13 Units into the skin 3 (three) times daily with meals. Sliding scale: If blood glucose is 70 to 130: take 4 units at beginning of meal.  CBG 131 to 160: 5 units.   161 to 190: 6 units.   191 to 220: 7 units.   221 to 250: 8 units.   251 to 280:  9 units.   281 to 310: then 10 units.   311 to 340: 11 units.   341 to 370: 12 units.   371 to 400:  13 units.   Over 400 mg/dL: 13 units and notify your MD 5 pen 3   No current facility-administered medications for this visit.    No Known Allergies  History   Social History  . Marital Status: Married    Spouse Name: N/A  . Number of Children: 3  . 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 Smoking status: Former Smoker -- 0.50 packs/day for 20 years  . Smokeless tobacco: Not on file  . Alcohol Use: 0.5 oz/week    1 drink(s) per week     Comment: 2-4 drinks per day  . Drug Use: No  . Sexual Activity: Not on file   Other Topics Concern  . Not on file   Social History Narrative    Family History  Problem Relation Age of Onset  . Colon cancer Mother   . Diabetes type II Mother   . Colon cancer Sister   . Coronary artery disease Neg Hx     No first degree relatives with CAD     Review of Systems:  As stated in the HPI and otherwise negative.   BP 118/80 mmHg  Pulse 59  Ht 5\' 7"  (1.702 m)  Wt 162 lb (73.483 kg)  BMI 25.37 kg/m2  Physical Examination: General: Well developed, well nourished, NAD HEENT: OP clear, mucus membranes moist SKIN: warm, dry. No rashes. Neuro: No focal deficits Musculoskeletal: Muscle strength 5/5 all ext Psychiatric: Mood and affect normal Neck: No JVD, no carotid bruits, no thyromegaly, no lymphadenopathy. Lungs:Clear bilaterally, no wheezes, rhonci, crackles Cardiovascular: Regular rate and rhythm. No murmurs, gallops or rubs. Abdomen:Soft. Bowel sounds present. Non-tender.  Extremities: No lower extremity edema. Pulses are 2 + in the bilateral DP/PT.  Cardiac cath 03/04/12:  Left main: No disease.  Left Anterior Descending Artery: Large caliber vessel that courses to the apex. The ostium has 25% stenosis. The proximal vessel has luminal irregularities. The mid vessel has a 99% stenosis. The distal vessel has no obstructive disease. There is a moderate sized diagonal branch with no disease noted.  Circumflex Artery: Moderate sized vessel with moderate sized first obtuse marginal branch. The AV groove Circumflex is moderate sized beyond the OM branch. No disease noted.  Right Coronary Artery: Large, dominant vessel with mild luminal irregularities in the proximal/mid and distal vessel. The PDA has 20% ostial stenosis.  Left Ventricular Angiogram: LVEF 40-45% with hypokinesis of the anterior wall and apex.  Impression:  1. Single vessel CAD, now s/p successful PTCA/DES x 1 mid LAD  2. Segmental LV systolic dysfunction  3. NSTEMI   Echo 09/06/12: Left ventricle: The cavity size was normal. Wall thickness was normal. Systolic function was normal. The estimated ejection fraction was in the range of 60% to 65%. Doppler parameters are consistent with abnormal left ventricular relaxation (grade 1 diastolic dysfunction). - Mitral  valve: Calcified annulus. Mildly thickened leaflets  EKG:  EKG is ordered today. The ekg ordered today demonstrates Sinus brady, rate 59 bpm. Early repolarization.   Recent Labs: No results found for requested labs within last 365 days.   Lipid Panel    Component Value Date/Time   CHOL 157 03/23/2013 0935   TRIG 115.0 03/23/2013 0935   HDL 59.90 03/23/2013 0935   CHOLHDL 3 03/23/2013 0935   VLDL 23.0 03/23/2013 0935   LDLCALC 74 03/23/2013 0935     Wt Readings from Last 3 Encounters:  02/12/15 162 lb (73.483 kg)  12/22/13 159 lb (72.122 kg)  03/23/13 158 lb (71.668 kg)     Other studies Reviewed: Additional studies/ records that were reviewed today include: . Review of the above records demonstrates:    Assessment and Plan:   1. CAD: s/p DES x 1 mid LAD during presentation for NSTEMI in setting of DKA in August 2013. He is doing well.  Will continue ASA, Plavix, beta blocker, Ace-inh and statin. His LV function is normal by echo 09/06/12. He has not been on an Ace-inhibitor due to hypotension. Continue daily exercise and heart healthy diet. No stress test since he is so active and doing well.   2. Hyperlipidemia: Change to Lipitor 20 mg every day due to cost. CMET and lipids in 12 weeks  Current medicines are reviewed at length with the patient today.  The patient does not have concerns regarding medicines.  The following changes have been made:  no change  Labs/ tests ordered today include:   Orders Placed This Encounter  Procedures  . Basic Metabolic Panel (BMET)  . Lipid Profile  . Hepatic function panel  . EKG 12-Lead    Disposition:   FU with me in 12  months  Signed, Lauree Chandler, MD 02/12/2015 8:50 AM    [facility] [address], [place], [place]  [address] Phone: [phone]; Fax: [phone]

## 2015-02-20 ENCOUNTER — Other Ambulatory Visit: Payer: Self-pay | Admitting: Cardiovascular Disease

## 2015-03-20 ENCOUNTER — Ambulatory Visit: Payer: BLUE CROSS/BLUE SHIELD | Admitting: *Deleted

## 2015-04-11 ENCOUNTER — Encounter: Payer: Self-pay | Admitting: *Deleted

## 2015-04-11 ENCOUNTER — Encounter: Payer: BLUE CROSS/BLUE SHIELD | Attending: Internal Medicine | Admitting: *Deleted

## 2015-04-11 ENCOUNTER — Ambulatory Visit: Payer: BLUE CROSS/BLUE SHIELD | Admitting: *Deleted

## 2015-04-11 DIAGNOSIS — E108 Type 1 diabetes mellitus with unspecified complications: Secondary | ICD-10-CM

## 2015-04-11 DIAGNOSIS — Z713 Dietary counseling and surveillance: Secondary | ICD-10-CM | POA: Diagnosis not present

## 2015-04-11 DIAGNOSIS — E1065 Type 1 diabetes mellitus with hyperglycemia: Secondary | ICD-10-CM

## 2015-04-11 DIAGNOSIS — IMO0002 Reserved for concepts with insufficient information to code with codable children: Secondary | ICD-10-CM

## 2015-04-12 ENCOUNTER — Ambulatory Visit: Payer: BLUE CROSS/BLUE SHIELD | Admitting: *Deleted

## 2015-04-13 ENCOUNTER — Ambulatory Visit: Payer: BLUE CROSS/BLUE SHIELD | Admitting: Dietician

## 2015-04-18 NOTE — Patient Instructions (Signed)
Plan: I have provided you with the contact information for the [employer]. IF you choose this pump, notify this office when it is shipped so we can set up training. Consider attending the DM 1 Support Group if interested. Consider identifying your Carb Choices by food group to assist in being more accurate with your food intake and insulin doses from your pump

## 2015-04-18 NOTE — Progress Notes (Signed)
Diabetes Self-Management Education  Visit Type: First/Initial  Appt. Start Time: 1430 Appt. End Time: 1600  04/18/2015  Mr. Jordan Waller, identified by name and date of birth, is a 59 y.o. male with a diagnosis of Diabetes: Type 1.   ASSESSMENT  There were no vitals taken for this visit. There is no weight on file to calculate BMI.      Diabetes Self-Management Education - 04/11/15 1459    Visit Information   Visit Type First/Initial   Initial Visit   Diabetes Type Type 1   Are you currently following a meal plan? No   Are you taking your medications as prescribed? No   Date Diagnosed 4 years ago   Health Coping   How would you rate your overall health? Good   Psychosocial Assessment   Patient Belief/Attitude about Diabetes Other (comment)  angry about diabetes   Self-care barriers None   Self-management support Family;Doctor's office;Friends   Other persons present Patient;Spouse/SO   Patient Concerns Nutrition/Meal planning;Glycemic Control   Preferred Learning Style Hands on   Learning Readiness Contemplating   How often do you need to have someone help you when you read instructions, pamphlets, or other written materials from your doctor or pharmacy? 1 - Never   Complications   Last HgB A1C per patient/outside source 10.2 %   How often do you check your blood sugar? 1-2 times/day   Fasting Blood glucose range (mg/dL) >200   Postprandial Blood glucose range (mg/dL) >200   Number of hypoglycemic episodes per month 0   Have you had a dilated eye exam in the past 12 months? Yes   Have you had a dental exam in the past 12 months? Yes   Are you checking your feet? Yes   How many days per week are you checking your feet? 3   Dietary Intake   Breakfast skips occasionally, eggs, bacon OR sausage biscuit OR bagel with cream cheese OR protein shake   Snack (morning) no   Lunch skips usually OR eats in sit down restaurant with meat, starch and vegetables,    Dinner meat,  starch and vegetables, ocasionally bread    Snack (evening) not usually, maybe fresh fruit   Beverage(s) coffee with sugar free creamer or black, OR water OR unsweet tea OR diet soda   Exercise   Exercise Type Moderate (swimming / aerobic walking)  golf twice a week, was going to gym 6 days a week before horse accident   How many days per week to you exercise? 3   How many minutes per day do you exercise? 120   Total minutes per week of exercise 360   Patient Education   Previous Diabetes Education Yes (please comment)  when went on insulin pump   Disease state  Definition of diabetes, type 1 and 2, and the diagnosis of diabetes   Nutrition management  Role of diet in the treatment of diabetes and the relationship between the three main macronutrients and blood glucose level;Food label reading, portion sizes and measuring food.;Carbohydrate counting  Introduced Carb Counting by [employer] for restaurants   Physical activity and exercise  Role of exercise on diabetes management, blood pressure control and cardiac health.   Medications Other (comment)  provided information on variety of insulin pumps as he is Out of warranty with his current Animas pump which he does not care for anymore.   Monitoring Purpose and frequency of SMBG.;Other (comment)  Explained concept of CGM and what his  options could be depending on which pump he chooses   Acute complications Discussed and identified patients' treatment of hyperglycemia.   Psychosocial adjustment Worked with patient to identify barriers to care and solutions;Role of stress on diabetes;Helped patient identify a support system for diabetes management  Informed he and his wife of DM 1 Support Group   Individualized Goals (developed by patient)   Nutrition General guidelines for healthy choices and portions discussed   Physical Activity Exercise 3-5 times per week   Monitoring  test blood glucose pre and post meals as discussed   Problem Solving  Consider CGM in addition to SMBG   Outcomes   Expected Outcomes Demonstrated interest in learning. Expect positive outcomes   Future DMSE PRN  He is interested in OmniPod insulin pump. He was instructed to call for appointment in this office when pump is shipped for training   Program Status Not Completed      Individualized Plan for Diabetes Self-Management Training:   Learning Objective:  Patient will have a greater understanding of diabetes self-management. Patient education plan is to attend individual and/or group sessions per assessed needs and concerns.   Plan:   Patient Instructions  Plan: I have provided you with the contact information for the [employer]. IF you choose this pump, notify this office when it is shipped so we can set up training. Consider attending the DM 1 Support Group if interested. Consider identifying your Carb Choices by food group to assist in being more accurate with your food intake and insulin doses from your pump   Expected Outcomes:  Demonstrated interest in learning. Expect positive outcomes  Education material provided: Living Well with Diabetes, Meal plan card, Support group flyer and Carbohydrate counting sheet  If problems or questions, patient to contact team via:  Phone and Email  Future DSME appointment: PRN (He is interested in OmniPod insulin pump. He was instructed to call for appointment in this office when pump is shipped for training)

## 2015-05-01 ENCOUNTER — Ambulatory Visit: Payer: BLUE CROSS/BLUE SHIELD | Admitting: *Deleted

## 2015-05-08 ENCOUNTER — Ambulatory Visit: Payer: BLUE CROSS/BLUE SHIELD | Admitting: *Deleted

## 2015-05-08 ENCOUNTER — Other Ambulatory Visit (INDEPENDENT_AMBULATORY_CARE_PROVIDER_SITE_OTHER): Payer: BLUE CROSS/BLUE SHIELD

## 2015-05-08 DIAGNOSIS — I25810 Atherosclerosis of coronary artery bypass graft(s) without angina pectoris: Secondary | ICD-10-CM | POA: Diagnosis not present

## 2015-05-08 DIAGNOSIS — E785 Hyperlipidemia, unspecified: Secondary | ICD-10-CM

## 2015-05-08 LAB — HEPATIC FUNCTION PANEL
ALT: 15 U/L (ref 9–46)
AST: 15 U/L (ref 10–35)
Albumin: 4 g/dL (ref 3.6–5.1)
Alkaline Phosphatase: 73 U/L (ref 40–115)
BILIRUBIN DIRECT: 0.1 mg/dL (ref <=0.2)
BILIRUBIN INDIRECT: 0.3 mg/dL (ref 0.2–1.2)
Total Bilirubin: 0.4 mg/dL (ref 0.2–1.2)
Total Protein: 6.3 g/dL (ref 6.1–8.1)

## 2015-05-08 LAB — BASIC METABOLIC PANEL
BUN: 18 mg/dL (ref 7–25)
CALCIUM: 8.8 mg/dL (ref 8.6–10.3)
CO2: 28 mmol/L (ref 20–31)
CREATININE: 0.82 mg/dL (ref 0.70–1.25)
Chloride: 101 mmol/L (ref 98–110)
GLUCOSE: 127 mg/dL — AB (ref 65–99)
Potassium: 3.9 mmol/L (ref 3.5–5.3)
Sodium: 136 mmol/L (ref 135–146)

## 2015-05-08 LAB — LIPID PANEL
CHOLESTEROL: 147 mg/dL (ref 125–200)
HDL: 54 mg/dL (ref >=40)
LDL Cholesterol: 79 mg/dL (ref <130)
TRIGLYCERIDES: 71 mg/dL (ref <150)
Total CHOL/HDL Ratio: 2.7 Ratio (ref <=5.0)
VLDL: 14 mg/dL (ref <30)

## 2015-05-08 NOTE — Addendum Note (Signed)
Addended by: Velna Ochs on: 05/08/2015 08:39 AM   Modules accepted: Orders

## 2015-06-18 ENCOUNTER — Other Ambulatory Visit: Payer: Self-pay | Admitting: Cardiovascular Disease

## 2015-06-30 ENCOUNTER — Other Ambulatory Visit: Payer: Self-pay | Admitting: Cardiovascular Disease

## 2015-10-03 ENCOUNTER — Encounter: Payer: BLUE CROSS/BLUE SHIELD | Attending: Internal Medicine | Admitting: *Deleted

## 2015-10-03 DIAGNOSIS — E108 Type 1 diabetes mellitus with unspecified complications: Secondary | ICD-10-CM

## 2015-10-03 DIAGNOSIS — IMO0002 Reserved for concepts with insufficient information to code with codable children: Secondary | ICD-10-CM

## 2015-10-03 DIAGNOSIS — E1065 Type 1 diabetes mellitus with hyperglycemia: Secondary | ICD-10-CM

## 2015-10-11 ENCOUNTER — Encounter: Payer: Self-pay | Admitting: *Deleted

## 2015-10-11 NOTE — Patient Instructions (Signed)
You have decided to move forward with Dexcom CGM and you will contact the local Rep, Annye English to start the process. Contact this office once Dexcom is shipped to set up training appointment

## 2015-10-11 NOTE — Progress Notes (Signed)
CGM Information:  Appt start time: 1400 end time:1500.  Assessment:  Primary concerns today: Patient here to learn more about Dexcom Continuous Glucose Monitoring. Patient is currently using Omni Pod insulin pump and is pleased with it.   Medications: see list     Intervention:   Explanation of Glucose Sensing Alerts available  High Glucose  Low Glucose   Snooze  Rate of Change  Use of Product  Entering BG, Calibration Technique and Graphs with [place] and Reports  Patient expresses interest in pursuing the Dexcom CGM product. Brochure and contact information provided to him. He chooses to contact the local Rep himself.   Follow Up Patient to schedule with me for CGM  Training once Dexcom is shipped

## 2016-01-03 ENCOUNTER — Other Ambulatory Visit: Payer: Self-pay | Admitting: Cardiovascular Disease

## 2016-02-26 ENCOUNTER — Other Ambulatory Visit: Payer: Self-pay | Admitting: Cardiovascular Disease

## 2016-04-06 ENCOUNTER — Other Ambulatory Visit: Payer: Self-pay | Admitting: Cardiovascular Disease

## 2016-04-09 ENCOUNTER — Other Ambulatory Visit: Payer: Self-pay

## 2016-04-09 MED ORDER — METOPROLOL TARTRATE 25 MG PO TABS: 25.0000 mg | ORAL_TABLET | Freq: Two times a day (BID) | ORAL | 0 refills | Status: DC

## 2016-04-09 MED ORDER — CLOPIDOGREL BISULFATE 75 MG PO TABS: 75.0000 mg | ORAL_TABLET | Freq: Every day | ORAL | 0 refills | Status: DC

## 2016-04-09 MED ORDER — ATORVASTATIN CALCIUM 20 MG PO TABS: ORAL_TABLET | ORAL | 0 refills | Status: DC

## 2016-06-11 ENCOUNTER — Encounter: Payer: Self-pay | Admitting: Cardiovascular Disease

## 2016-06-11 ENCOUNTER — Encounter (INDEPENDENT_AMBULATORY_CARE_PROVIDER_SITE_OTHER): Payer: Self-pay

## 2016-06-11 ENCOUNTER — Ambulatory Visit (INDEPENDENT_AMBULATORY_CARE_PROVIDER_SITE_OTHER): Payer: BLUE CROSS/BLUE SHIELD | Admitting: Cardiovascular Disease

## 2016-06-11 VITALS — BP 110/70 | HR 71 | Ht 67.0 in | Wt 168.4 lb

## 2016-06-11 DIAGNOSIS — I25810 Atherosclerosis of coronary artery bypass graft(s) without angina pectoris: Secondary | ICD-10-CM | POA: Diagnosis not present

## 2016-06-11 DIAGNOSIS — E7800 Pure hypercholesterolemia, unspecified: Secondary | ICD-10-CM | POA: Diagnosis not present

## 2016-06-11 MED ORDER — ROSUVASTATIN CALCIUM 10 MG PO TABS: 10.0000 mg | ORAL_TABLET | Freq: Every day | ORAL | 3 refills | Status: DC

## 2016-06-11 MED ORDER — METOPROLOL TARTRATE 25 MG PO TABS
25.0000 mg | ORAL_TABLET | Freq: Two times a day (BID) | ORAL | 3 refills | Status: DC
Start: 2016-06-11 — End: 2017-07-01

## 2016-06-11 MED ORDER — CLOPIDOGREL BISULFATE 75 MG PO TABS: 75.0000 mg | ORAL_TABLET | Freq: Every day | ORAL | 3 refills | Status: DC

## 2016-06-11 NOTE — Progress Notes (Signed)
Chief Complaint  Patient presents with  . Coronary Artery Disease    History of Present Illness: 60 yo white male with history of CAD, DM, HLD,  former smoker who is here today for cardiac follow up. He presented to [facility] August 2013 with weakness, nausea, vomiting, generalized pain, and elevated blood sugars starting 02/29/12. He was found to be in DKA. Cardiac cath on 03/04/12 showed a 99% mid LAD stenosis. This was treated with a 3.0 x 16 mm Promus Element DES.    He is here today for cardiac follow up. He has been feeling well. He has had no chest pain or SOB. He has been exercising every day. He has been tolerating all medications.   Primary Care Physician: Melinda Crutch, MD   Past Medical History:  Diagnosis Date  . CAD (coronary artery disease)    NSTEMI August 2013, DES mid LAD  . Diabetes mellitus   . High cholesterol     Past Surgical History:  Procedure Laterality Date  . LEFT HEART CATHETERIZATION WITH CORONARY ANGIOGRAM N/A 03/04/2012   Procedure: LEFT HEART CATHETERIZATION WITH CORONARY ANGIOGRAM;  Surgeon: Burnell Blanks, MD;  Location: [facility] CATH LAB;  Service: Cardiovascular;  Laterality: N/A;  . ROTATOR CUFF REPAIR    . TONSILLECTOMY AND ADENOIDECTOMY      Current Outpatient Prescriptions  Medication Sig Dispense Refill  . aspirin EC 81 MG tablet Take 81 mg by mouth daily.    Marland Kitchen b complex vitamins capsule Take 1 capsule by mouth daily.    . BD PEN NEEDLE NANO U/F 32G X 4 MM MISC as directed.    . clopidogrel (PLAVIX) 75 MG tablet Take 1 tablet (75 mg total) by mouth daily. 90 tablet 3  . doxycycline (VIBRA-TABS) 100 MG tablet Take 100 mg by mouth 2 (two) times daily.     . insulin glargine (LANTUS) 100 UNIT/ML injection Inject 15 Units into the skin at bedtime. Reported on 10/11/2015    . Insulin Pen Needle 32G X 4 MM MISC by Does not apply route.    Marland Kitchen lisinopril (PRINIVIL,ZESTRIL) 2.5 MG tablet Take 2.5 mg by mouth daily.  3  . metoprolol  tartrate (LOPRESSOR) 25 MG tablet Take 1 tablet (25 mg total) by mouth 2 (two) times daily. 180 tablet 3  . nitroGLYCERIN (NITROSTAT) 0.4 MG SL tablet Place 0.4 mg under the tongue every 5 (five) minutes as needed for chest pain. Up to 3 times then call 911    . NOVOLOG 100 UNIT/ML injection BASAL(UNITS/HOUR)12AM 0.9,CARB RATIO 10GRAMS/UNIT,SENSITIVITY 40MG /DL PER UNIT,TARGET 100MG /ML DAILY  5  . rosuvastatin (CRESTOR) 10 MG tablet Take 1 tablet (10 mg total) by mouth daily. 90 tablet 3  . insulin aspart (NOVOLOG) 100 UNIT/ML injection Inject 4-13 Units into the skin 3 (three) times daily with meals. Sliding scale: If blood glucose is 70 to 130: take 4 units at beginning of meal.  CBG 131 to 160: 5 units.   161 to 190: 6 units.   191 to 220: 7 units.   221 to 250: 8 units.   251 to 280:  9 units.   281 to 310: then 10 units.   311 to 340: 11 units.   341 to 370: 12 units.   371 to 400: 13 units.   Over 400 mg/dL: 13 units and notify your MD 5 pen 3   No current facility-administered medications for this visit.     No Known Allergies  Social History  Social History  . Marital status: Married    Spouse name: N/A  . Number of children: 3  . Years of education: N/A   Occupational History  . [employer] vice Field seismologist   Social History Main Topics  . Smoking status: Former Smoker    Packs/day: 0.50    Years: 20.00  . Smokeless tobacco: Not on file  . Alcohol use 0.5 oz/week    1 drink(s) per week     Comment: 2-4 drinks per day  . Drug use: No  . Sexual activity: Not on file   Other Topics Concern  . Not on file   Social History Narrative  . No narrative on file    Family History  Problem Relation Age of Onset  . Colon cancer Mother   . Diabetes type II Mother   . Colon cancer Sister   . Coronary artery disease Neg Hx     No first degree relatives with CAD    Review of Systems:  As stated in the HPI and otherwise negative.   BP 110/70   Pulse 71   Ht 5\' 7"   (1.702 m)   Wt 168 lb 6.4 oz (76.4 kg)   BMI 26.38 kg/m   Physical Examination: General: Well developed, well nourished, NAD  HEENT: OP clear, mucus membranes moist  SKIN: warm, dry. No rashes. Neuro: No focal deficits  Musculoskeletal: Muscle strength 5/5 all ext  Psychiatric: Mood and affect normal  Neck: No JVD, no carotid bruits, no thyromegaly, no lymphadenopathy.  Lungs:Clear bilaterally, no wheezes, rhonci, crackles Cardiovascular: Regular rate and rhythm. No murmurs, gallops or rubs. Abdomen:Soft. Bowel sounds present. Non-tender.  Extremities: No lower extremity edema. Pulses are 2 + in the bilateral DP/PT.  Cardiac cath 03/04/12:  Left main: No disease.  Left Anterior Descending Artery: Large caliber vessel that courses to the apex. The ostium has 25% stenosis. The proximal vessel has luminal irregularities. The mid vessel has a 99% stenosis. The distal vessel has no obstructive disease. There is a moderate sized diagonal branch with no disease noted.  Circumflex Artery: Moderate sized vessel with moderate sized first obtuse marginal branch. The AV groove Circumflex is moderate sized beyond the OM branch. No disease noted.  Right Coronary Artery: Large, dominant vessel with mild luminal irregularities in the proximal/mid and distal vessel. The PDA has 20% ostial stenosis.  Left Ventricular Angiogram: LVEF 40-45% with hypokinesis of the anterior wall and apex.  Impression:  1. Single vessel CAD, now s/p successful PTCA/DES x 1 mid LAD  2. Segmental LV systolic dysfunction  3. NSTEMI   Echo 09/06/12: Left ventricle: The cavity size was normal. Wall thickness was normal. Systolic function was normal. The estimated ejection fraction was in the range of 60% to 65%. Doppler parameters are consistent with abnormal left ventricular relaxation (grade 1 diastolic dysfunction). - Mitral valve: Calcified annulus. Mildly thickened leaflets  EKG:  EKG is  ordered today. The ekg  ordered today demonstrates NSR, rate 71 bpm.   Recent Labs: No results found for requested labs within last 8760 hours.   Lipid Panel    Component Value Date/Time   CHOL 147 05/08/2015 0839   TRIG 71 05/08/2015 0839   HDL 54 05/08/2015 0839   CHOLHDL 2.7 05/08/2015 0839   VLDL 14 05/08/2015 0839   LDLCALC 79 05/08/2015 0839     Wt Readings from Last 3 Encounters:  06/11/16 168 lb 6.4 oz (76.4 kg)  02/12/15 162 lb (73.5 kg)  12/22/13 159 lb (72.1 kg)     Other studies Reviewed: Additional studies/ records that were reviewed today include: . Review of the above records demonstrates:    Assessment and Plan:   1. CAD: He has had no chest pain suggestive of angina. He had a drug eluting stent placed in his mid LAD in August 2013 during presentation for NSTEMI in setting of DKA.  Will continue ASA, Plavix, beta blocker, Ace-inh and statin. His LV function is normal by echo 09/06/12. Continue daily exercise and heart healthy diet. Will arrange exercise stress test since he has had no recent ischemic testing.   2. Hyperlipidemia: Continue statin. LDL near goal of 70. Recently checked in primary care.     Current medicines are reviewed at length with the patient today.  The patient does not have concerns regarding medicines.  The following changes have been made:  no change  Labs/ tests ordered today include:   Orders Placed This Encounter  Procedures  . Exercise Tolerance Test  . EKG 12-Lead    Disposition:   FU with me in 12  months  Signed, Lauree Chandler, MD 06/11/2016 10:02 AM    [facility] [address] [place], [place], [place]  [address] Phone: [phone]; Fax: [phone]

## 2016-06-11 NOTE — Patient Instructions (Signed)
Medication Instructions:  Your physician recommends that you continue on your current medications as directed. Please refer to the Current Medication list given to you today.   Labwork: none  Testing/Procedures: Your physician has requested that you have an exercise tolerance test. For further information please visit [email or web address]. Please also follow instruction sheet, as given.    Follow-Up: Your physician recommends that you schedule a follow-up appointment in: 12 months. Please call us in August 2018 to schedule this appointment.     Any Other Special Instructions Will Be Listed Below (If Applicable).     If you need a refill on your cardiac medications before your next appointment, please call your pharmacy.

## 2016-06-26 ENCOUNTER — Ambulatory Visit (INDEPENDENT_AMBULATORY_CARE_PROVIDER_SITE_OTHER): Payer: BLUE CROSS/BLUE SHIELD

## 2016-06-26 DIAGNOSIS — I25810 Atherosclerosis of coronary artery bypass graft(s) without angina pectoris: Secondary | ICD-10-CM

## 2016-06-27 LAB — EXERCISE TOLERANCE TEST
CHL CUP MPHR: 158 {beats}/min
CHL CUP RESTING HR STRESS: 76 {beats}/min
CHL CUP STRESS STAGE 1 SBP: 116 mmHg
CHL CUP STRESS STAGE 2 GRADE: 0 %
CHL CUP STRESS STAGE 3 GRADE: 0.2 %
CHL CUP STRESS STAGE 4 HR: 102 {beats}/min
CHL CUP STRESS STAGE 4 SBP: 147 mmHg
CHL CUP STRESS STAGE 5 DBP: 56 mmHg
CHL CUP STRESS STAGE 7 SPEED: 4.2 mph
CHL CUP STRESS STAGE 8 DBP: 81 mmHg
CHL CUP STRESS STAGE 8 GRADE: 0 %
CHL CUP STRESS STAGE 8 HR: 130 {beats}/min
CHL CUP STRESS STAGE 9 GRADE: 0 %
CHL CUP STRESS STAGE 9 HR: 93 {beats}/min
CHL CUP STRESS STAGE 9 SBP: 152 mmHg
CHL RATE OF PERCEIVED EXERTION: 17
CSEPED: 10 min
CSEPEDS: 0 s
CSEPEW: 11.7 METS
CSEPHR: 101 %
CSEPPHR: 160 {beats}/min
Percent of predicted max HR: 101 %
Stage 1 DBP: 60 mmHg
Stage 1 Grade: 0 %
Stage 1 HR: 81 {beats}/min
Stage 1 Speed: 0 mph
Stage 2 HR: 88 {beats}/min
Stage 2 Speed: 1 mph
Stage 3 HR: 89 {beats}/min
Stage 3 Speed: 1 mph
Stage 4 DBP: 62 mmHg
Stage 4 Grade: 10 %
Stage 4 Speed: 1.7 mph
Stage 5 Grade: 12 %
Stage 5 HR: 118 {beats}/min
Stage 5 SBP: 171 mmHg
Stage 5 Speed: 2.5 mph
Stage 6 DBP: 62 mmHg
Stage 6 Grade: 14 %
Stage 6 HR: 139 {beats}/min
Stage 6 SBP: 202 mmHg
Stage 6 Speed: 3.4 mph
Stage 7 Grade: 16 %
Stage 7 HR: 160 {beats}/min
Stage 8 SBP: 178 mmHg
Stage 8 Speed: 0 mph
Stage 9 DBP: 65 mmHg
Stage 9 Speed: 0 mph

## 2017-06-18 ENCOUNTER — Other Ambulatory Visit: Payer: Self-pay | Admitting: Cardiovascular Disease

## 2017-07-01 ENCOUNTER — Other Ambulatory Visit: Payer: Self-pay | Admitting: Cardiovascular Disease

## 2017-07-18 ENCOUNTER — Other Ambulatory Visit: Payer: Self-pay | Admitting: Cardiovascular Disease

## 2017-07-23 ENCOUNTER — Telehealth: Payer: Self-pay | Admitting: Cardiovascular Disease

## 2017-07-23 NOTE — Telephone Encounter (Signed)
New Message     [facility] Pre-operative Risk Assessment    Request for surgical clearance:  What type of surgery is being performed? Tooth extraction  1. When is this surgery scheduled? 07/27/2017  2. Are there any medications that need to be held prior to surgery and how long? plavix 3/5 days prior   3. Practice name and name of physician performing surgery? Dr. Elmer Ramp office. Dr. Herbie Baltimore Know doing procedure  4. What is your office phone and fax number? [phone] fax [phone]  5. Anesthesia type (None, local, MAC, general) ?    Jordan Waller 07/23/2017, 10:34 AM  _________________________________________________________________   (provider comments below)

## 2017-07-23 NOTE — Telephone Encounter (Signed)
Hoyle Sauer at Dr. Jackson Latino office notified. Will fax this phone note to number listed below.

## 2017-07-23 NOTE — Telephone Encounter (Signed)
Ok to hold Plavix 5 days before procedure.   Lauree Chandler 07/23/2017 10:55 AM

## 2017-07-24 ENCOUNTER — Other Ambulatory Visit: Payer: Self-pay | Admitting: Cardiovascular Disease

## 2017-08-04 ENCOUNTER — Other Ambulatory Visit: Payer: Self-pay | Admitting: Cardiovascular Disease

## 2017-10-01 ENCOUNTER — Other Ambulatory Visit: Payer: Self-pay | Admitting: Cardiovascular Disease

## 2017-10-14 ENCOUNTER — Other Ambulatory Visit: Payer: Self-pay | Admitting: Cardiovascular Disease

## 2017-10-16 ENCOUNTER — Encounter: Payer: Self-pay | Admitting: Cardiovascular Disease

## 2017-10-16 ENCOUNTER — Ambulatory Visit (INDEPENDENT_AMBULATORY_CARE_PROVIDER_SITE_OTHER): Payer: PRIVATE HEALTH INSURANCE | Admitting: Cardiovascular Disease

## 2017-10-16 VITALS — BP 104/68 | HR 74 | Ht 67.0 in | Wt 165.0 lb

## 2017-10-16 DIAGNOSIS — E7800 Pure hypercholesterolemia, unspecified: Secondary | ICD-10-CM

## 2017-10-16 DIAGNOSIS — I25810 Atherosclerosis of coronary artery bypass graft(s) without angina pectoris: Secondary | ICD-10-CM | POA: Diagnosis not present

## 2017-10-16 NOTE — Patient Instructions (Signed)

## 2017-10-16 NOTE — Progress Notes (Signed)
Chief Complaint  Patient presents with  . Coronary Artery Disease    History of Present Illness: 62 yo male with history of CAD, DM, HLD and former tobacco abuse who is here today for cardiac follow up. He presented to [facility] August 2013 with weakness, nausea, vomiting, generalized pain, and elevated blood sugars starting 02/29/12. He was found to be in DKA. Cardiac cath on 03/04/12 showed a 99% mid LAD stenosis. This was treated with a 3.0 x 16 mm Promus Element DES. Normal exercise stress test in December 2017.    He is here today for follow up. The patient denies any chest pain, dyspnea, palpitations, lower extremity edema, orthopnea, PND, dizziness, near syncope or syncope. He is exercising several days per week.   Primary Care Physician: Lawerance Cruel, MD   Past Medical History:  Diagnosis Date  . CAD (coronary artery disease)    NSTEMI August 2013, DES mid LAD  . Diabetes mellitus   . High cholesterol     Past Surgical History:  Procedure Laterality Date  . LEFT HEART CATHETERIZATION WITH CORONARY ANGIOGRAM N/A 03/04/2012   Procedure: LEFT HEART CATHETERIZATION WITH CORONARY ANGIOGRAM;  Surgeon: Burnell Blanks, MD;  Location: [facility] CATH LAB;  Service: Cardiovascular;  Laterality: N/A;  . ROTATOR CUFF REPAIR    . TONSILLECTOMY AND ADENOIDECTOMY      Current Outpatient Medications  Medication Sig Dispense Refill  . aspirin EC 81 MG tablet Take 81 mg by mouth daily.    Marland Kitchen b complex vitamins capsule Take 1 capsule by mouth daily.    . BD PEN NEEDLE NANO U/F 32G X 4 MM MISC as directed.    . clopidogrel (PLAVIX) 75 MG tablet TAKE 1 TABLET (75 MG TOTAL) BY MOUTH DAILY. PLEASE KEEP UPCOMING APPT FOR FUTURE REFILLS. THANK YOU 30 tablet 0  . doxycycline (VIBRA-TABS) 100 MG tablet Take 100 mg by mouth 2 (two) times daily.     . insulin glargine (LANTUS) 100 UNIT/ML injection Inject 15 Units into the skin at bedtime. Reported on 10/11/2015    . Insulin Pen  Needle 32G X 4 MM MISC by Does not apply route.    Marland Kitchen lisinopril (PRINIVIL,ZESTRIL) 2.5 MG tablet Take 2.5 mg by mouth daily.  3  . metoprolol tartrate (LOPRESSOR) 25 MG tablet Take 1 tablet (25 mg total) by mouth 2 (two) times daily. Please keep upcoming appt for future refills. Thank you 60 tablet 0  . nitroGLYCERIN (NITROSTAT) 0.4 MG SL tablet Place 0.4 mg under the tongue every 5 (five) minutes as needed for chest pain. Up to 3 times then call 911    . NOVOLOG 100 UNIT/ML injection BASAL(UNITS/HOUR)12AM 0.9,CARB RATIO 10GRAMS/UNIT,SENSITIVITY 40MG /DL PER UNIT,TARGET 100MG /ML DAILY  5  . rosuvastatin (CRESTOR) 10 MG tablet PLEASE SEE ATTACHED FOR DETAILED DIRECTIONS 30 tablet 0  . insulin aspart (NOVOLOG) 100 UNIT/ML injection Inject 4-13 Units into the skin 3 (three) times daily with meals. Sliding scale: If blood glucose is 70 to 130: take 4 units at beginning of meal.  CBG 131 to 160: 5 units.   161 to 190: 6 units.   191 to 220: 7 units.   221 to 250: 8 units.   251 to 280:  9 units.   281 to 310: then 10 units.   311 to 340: 11 units.   341 to 370: 12 units.   371 to 400: 13 units.   Over 400 mg/dL: 13 units and notify your MD  5 pen 3   No current facility-administered medications for this visit.     No Known Allergies  Social History   Socioeconomic History  . Marital status: Married    Spouse name: Not on file  . Number of children: 3  . Years of education: Not on file  . Highest education level: Not on file  Occupational History  . Occupation: Metallurgist: [employer]  Social Needs  . Financial resource strain: Not on file  . Food insecurity:    Worry: Not on file    Inability: Not on file  . Transportation needs:    Medical: Not on file    Non-medical: Not on file  Tobacco Use  . Smoking status: Former Smoker    Packs/day: 0.50    Years: 20.00    Pack years: 10.00  Substance and Sexual Activity  . Alcohol use: Yes    Alcohol/week: 0.5 oz     Types: 1 drink(s) per week    Comment: 2-4 drinks per day  . Drug use: No  . Sexual activity: Not on file  Lifestyle  . Physical activity:    Days per week: Not on file    Minutes per session: Not on file  . Stress: Not on file  Relationships  . Social connections:    Talks on phone: Not on file    Gets together: Not on file    Attends religious service: Not on file    Active member of club or organization: Not on file    Attends meetings of clubs or organizations: Not on file    Relationship status: Not on file  . Intimate partner violence:    Fear of current or ex partner: Not on file    Emotionally abused: Not on file    Physically abused: Not on file    Forced sexual activity: Not on file  Other Topics Concern  . Not on file  Social History Narrative  . Not on file    Family History  Problem Relation Age of Onset  . Colon cancer Mother   . Diabetes type II Mother   . Colon cancer Sister   . Coronary artery disease Neg Hx        No first degree relatives with CAD    Review of Systems:  As stated in the HPI and otherwise negative.   BP 104/68   Pulse 74   Ht 5\' 7"  (1.702 m)   Wt 165 lb (74.8 kg)   SpO2 98%   BMI 25.84 kg/m   Physical Examination:  General: Well developed, well nourished, NAD  HEENT: OP clear, mucus membranes moist  SKIN: warm, dry. No rashes. Neuro: No focal deficits  Musculoskeletal: Muscle strength 5/5 all ext  Psychiatric: Mood and affect normal  Neck: No JVD, no carotid bruits, no thyromegaly, no lymphadenopathy.  Lungs:Clear bilaterally, no wheezes, rhonci, crackles Cardiovascular: Regular rate and rhythm. No murmurs, gallops or rubs. Abdomen:Soft. Bowel sounds present. Non-tender.  Extremities: No lower extremity edema. Pulses are 2 + in the bilateral DP/PT.  Cardiac cath 03/04/12:  Left main: No disease.  Left Anterior Descending Artery: Large caliber vessel that courses to the apex. The ostium has 25% stenosis. The proximal  vessel has luminal irregularities. The mid vessel has a 99% stenosis. The distal vessel has no obstructive disease. There is a moderate sized diagonal branch with no disease noted.  Circumflex Artery: Moderate sized vessel with moderate sized first  obtuse marginal branch. The AV groove Circumflex is moderate sized beyond the OM branch. No disease noted.  Right Coronary Artery: Large, dominant vessel with mild luminal irregularities in the proximal/mid and distal vessel. The PDA has 20% ostial stenosis.  Left Ventricular Angiogram: LVEF 40-45% with hypokinesis of the anterior wall and apex.  Impression:  1. Single vessel CAD, now s/p successful PTCA/DES x 1 mid LAD  2. Segmental LV systolic dysfunction  3. NSTEMI   Echo 09/06/12: Left ventricle: The cavity size was normal. Wall thickness was normal. Systolic function was normal. The estimated ejection fraction was in the range of 60% to 65%. Doppler parameters are consistent with abnormal left ventricular relaxation (grade 1 diastolic dysfunction). - Mitral valve: Calcified annulus. Mildly thickened leaflets  EKG:  EKG is ordered today. The ekg ordered today demonstrates NSR, rate 74 bpm.   Recent Labs: No results found for requested labs within last 8760 hours.   Lipid Panel Followed in primary care.    Wt Readings from Last 3 Encounters:  10/16/17 165 lb (74.8 kg)  06/11/16 168 lb 6.4 oz (76.4 kg)  02/12/15 162 lb (73.5 kg)     Other studies Reviewed: Additional studies/ records that were reviewed today include: . Review of the above records demonstrates:    Assessment and Plan:   1. CAD without angina: He had a drug eluting stent placed in his mid LAD in August 2013 during presentation for NSTEMI in setting of DKA.  Exercise stress test December 2017 without ischemia. Will continue ASA, Plavix, statin and beta blocker.   2. Hyperlipidemia: Lipids followed in primary care. Continue statin.      Current medicines are  reviewed at length with the patient today.  The patient does not have concerns regarding medicines.  The following changes have been made:  no change  Labs/ tests ordered today include:   No orders of the defined types were placed in this encounter.   Disposition:   FU with me in 12  months  Signed, Lauree Chandler, MD 10/16/2017 4:56 PM    [facility] [place], [place], [place]  [address] Phone: [phone]; Fax: [phone]

## 2017-11-05 ENCOUNTER — Other Ambulatory Visit: Payer: Self-pay | Admitting: Cardiovascular Disease

## 2018-01-20 ENCOUNTER — Other Ambulatory Visit: Payer: Self-pay | Admitting: Cardiovascular Disease

## 2018-02-21 ENCOUNTER — Other Ambulatory Visit: Payer: Self-pay | Admitting: Cardiovascular Disease

## 2018-07-02 ENCOUNTER — Other Ambulatory Visit: Payer: Self-pay | Admitting: *Deleted

## 2018-07-02 MED ORDER — METOPROLOL TARTRATE 25 MG PO TABS: 25.0000 mg | ORAL_TABLET | Freq: Two times a day (BID) | ORAL | 0 refills | Status: DC

## 2018-07-02 MED ORDER — CLOPIDOGREL BISULFATE 75 MG PO TABS: 75.0000 mg | ORAL_TABLET | Freq: Every day | ORAL | 0 refills | Status: DC

## 2018-08-18 ENCOUNTER — Other Ambulatory Visit: Payer: Self-pay | Admitting: Cardiovascular Disease

## 2018-08-18 MED ORDER — ROSUVASTATIN CALCIUM 10 MG PO TABS: 10.0000 mg | ORAL_TABLET | Freq: Every day | ORAL | 1 refills | Status: DC

## 2018-11-08 ENCOUNTER — Other Ambulatory Visit: Payer: Self-pay | Admitting: Cardiovascular Disease

## 2018-11-09 ENCOUNTER — Other Ambulatory Visit: Payer: Self-pay | Admitting: Cardiovascular Disease

## 2018-12-10 ENCOUNTER — Telehealth: Payer: Self-pay | Admitting: *Deleted

## 2018-12-10 NOTE — Telephone Encounter (Signed)
LVM ON WIFE NUMBER PT VM FULL TO CALL BACK ABOUT APPT

## 2018-12-14 ENCOUNTER — Telehealth: Payer: Self-pay | Admitting: *Deleted

## 2018-12-14 DIAGNOSIS — I2510 Atherosclerotic heart disease of native coronary artery without angina pectoris: Secondary | ICD-10-CM | POA: Insufficient documentation

## 2018-12-14 NOTE — Telephone Encounter (Signed)
PT VM FULL LM ON PT WIFE PHONE

## 2018-12-14 NOTE — Progress Notes (Unsigned)
{Choose 1 Note Type (Telehealth Visit or Telephone Visit):[id number]}   Date:  12/14/2018   ID:  Jordan Waller, DOB 12-Nov-1955, MRN [id number]  {Patient [phone]::"Home"} {Provider Location:[phone]::"Home"}  PCP:  Lawerance Cruel, MD  Cardiologist:  Lauree Chandler, MD *** Electrophysiologist:  None   Evaluation Performed:  {Choose Visit ZCHY:[id number]::"[facility] Visit"}  Chief Complaint:  ***  History of Present Illness:    Jordan Waller is a 63 y.o. male former smoker with coronary artery disease, diabetes, hyperlipidemia.  he presented in 02/2012 with a NSTEMI in the setting of DKA.  Cardiac Catheterization demonstrated 99% mid LAD stenosis that was tx with a DES.  He was last seen by Dr. Angelena Form in 09/2017.    ***  The patient {does/does not:200015} have symptoms concerning for COVID-19 infection (fever, chills, cough, or new shortness of breath).    Past Medical History:  Diagnosis Date  . CAD (coronary artery disease)    NSTEMI August 2013, DES mid LAD  . Diabetes mellitus   . High cholesterol    Past Surgical History:  Procedure Laterality Date  . LEFT HEART CATHETERIZATION WITH CORONARY ANGIOGRAM N/A 03/04/2012   Procedure: LEFT HEART CATHETERIZATION WITH CORONARY ANGIOGRAM;  Surgeon: Burnell Blanks, MD;  Location: [facility] CATH LAB;  Service: Cardiovascular;  Laterality: N/A;  . ROTATOR CUFF REPAIR    . TONSILLECTOMY AND ADENOIDECTOMY       No outpatient medications have been marked as taking for the 12/15/18 encounter (Appointment) with Richardson Dopp T, PA-C.     Allergies:   Patient has no known allergies.   Social History   Tobacco Use  . Smoking status: Former Smoker    Packs/day: 0.50    Years: 20.00    Pack years: 10.00  Substance Use Topics  . Alcohol use: Yes    Alcohol/week: 1.0 standard drinks    Types: 1 drink(s) per week    Comment: 2-4 drinks per day  . Drug use: No     Family Hx: The patient's family history  includes Colon cancer in his mother and sister; Diabetes type II in his mother. There is no history of Coronary artery disease.  ROS:   Please see the history of present illness.    *** All other systems reviewed and are negative.   Prior CV studies:   The following studies were reviewed today:  ET 04-Jul-2016  Blood pressure demonstrated a hypertensive response to exercise.  There was no ST segment deviation noted during stress.  Negative exercise tolerance test at an adequate heart rate of 101% MPHR. The patient was asymptomatic.  Excellent exercise capacity at 11.7 mets.  This is a low risk study.  Echo 08/27/12 - Left ventricle: The cavity size was normal. Wall thickness  was normal. Systolic function was normal. The estimated  ejection fraction was in the range of 60% to 65%. Doppler  parameters are consistent with abnormal left ventricular  relaxation (grade 1 diastolic dysfunction).  - Mitral valve: Calcified annulus. Mildly thickened leaflets   Cardiac Catheterization 03/04/12 LM normal  LAD ost 25, mid 99 RCA irregs, PDA ost 20 EF 40-45 with ant/apical HK PCI:  3 x 16 mm Promus DES to mid LAD   Labs/Other Tests and Data Reviewed:    EKG:  {EKG/Telemetry Strips Reviewed:[phone]}  Recent Labs: No results found for requested labs within last 8760 hours.   Recent Lipid Panel Lab Results  Component Value Date/Time   CHOL 147 05/08/2015 08:39 AM   TRIG  71 05/08/2015 08:39 AM   HDL 54 05/08/2015 08:39 AM   CHOLHDL 2.7 05/08/2015 08:39 AM   LDLCALC 79 05/08/2015 08:39 AM    Wt Readings from Last 3 Encounters:  10/16/17 165 lb (74.8 kg)  06/11/16 168 lb 6.4 oz (76.4 kg)  02/12/15 162 lb (73.5 kg)     Objective:    Vital Signs:  There were no vitals taken for this visit.   {HeartCare Virtual Exam (Optional):[phone]::"VITAL SIGNS:  reviewed"}  ASSESSMENT & PLAN:    *** Coronary artery disease involving native coronary artery of native heart without  angina pectoris  Pure hypercholesterolemia  Type 2 diabetes mellitus with complication, with long-term current use of insulin ([place])  Educated About Covid-19 Virus Infection    COVID-19 Education: The signs and symptoms of COVID-19 were discussed with the patient and how to seek care for testing (follow up with PCP or arrange E-visit).  ***The importance of social distancing was discussed today.  Time:   Today, I have spent *** minutes with the patient with telehealth technology discussing the above problems.     Medication Adjustments/Labs and Tests Ordered: Current medicines are reviewed at length with the patient today.  Concerns regarding medicines are outlined above.   Tests Ordered: No orders of the defined types were placed in this encounter.   Medication Changes: No orders of the defined types were placed in this encounter.   Disposition:  Follow up {follow up:15908}  Signed, Richardson Dopp, PA-C  12/14/2018 8:06 PM    [facility]

## 2018-12-15 ENCOUNTER — Telehealth: Payer: Self-pay | Admitting: Physician Assistant

## 2018-12-15 ENCOUNTER — Other Ambulatory Visit: Payer: Self-pay

## 2018-12-15 ENCOUNTER — Ambulatory Visit: Payer: PRIVATE HEALTH INSURANCE | Admitting: Physician Assistant

## 2018-12-15 ENCOUNTER — Telehealth: Payer: Self-pay | Admitting: *Deleted

## 2018-12-15 DIAGNOSIS — E7800 Pure hypercholesterolemia, unspecified: Secondary | ICD-10-CM

## 2018-12-15 DIAGNOSIS — I2510 Atherosclerotic heart disease of native coronary artery without angina pectoris: Secondary | ICD-10-CM

## 2018-12-15 DIAGNOSIS — Z794 Long term (current) use of insulin: Secondary | ICD-10-CM

## 2018-12-15 DIAGNOSIS — E118 Type 2 diabetes mellitus with unspecified complications: Secondary | ICD-10-CM

## 2018-12-15 DIAGNOSIS — Z7189 Other specified counseling: Secondary | ICD-10-CM

## 2018-12-15 NOTE — Telephone Encounter (Signed)
Called Pt. About appointment and get consent got VM couldn't leave message vm full. Tried wife home # disconnected called mobile# left msge on vm

## 2018-12-15 NOTE — Telephone Encounter (Signed)
Follow Up:   Wife called and said shetj just received your message today about Jordan Waller's appt. She is so sorry about this. [place] is [place] and will not return turn until 12-24-18.

## 2018-12-28 ENCOUNTER — Telehealth: Payer: Self-pay | Admitting: *Deleted

## 2018-12-28 ENCOUNTER — Telehealth: Payer: Self-pay | Admitting: Physician Assistant

## 2018-12-28 NOTE — Telephone Encounter (Signed)
New Message ° ° ° °Pts wife is returning call  ° ° °Please call back  °

## 2018-12-28 NOTE — Telephone Encounter (Signed)
Lvm on home and wife  cell to cal back about consent for virtual visit

## 2018-12-28 NOTE — Telephone Encounter (Signed)
Patient called back to consent to telephone visit   Confirm consent - "In the setting of the current Covid19 crisis, you are scheduled for a (phone or video) visit with your provider on (date) at (time).  Just as we do with many in-office visits, in order for you to participate in this visit, we must obtain consent.  If you'd like, I can send this to your mychart (if signed up) or email for you to review.  Otherwise, I can obtain your verbal consent now.  All virtual visits are billed to your insurance company just like a normal visit would be.  By agreeing to a virtual visit, we'd like you to understand that the technology does not allow for your provider to perform an examination, and thus may limit your provider's ability to fully assess your condition. If your provider identifies any concerns that need to be evaluated in person, we will make arrangements to do so.  Finally, though the technology is pretty good, we cannot assure that it will always work on either your or our end, and in the setting of a video visit, we may have to convert it to a phone-only visit.  In either situation, we cannot ensure that we have a secure connection.  Are you willing to proceed?" STAFF: Did the patient verbally acknowledge consent to telehealth visit? Document YES/NO here YES    TELEPHONE CALL NOTE  Jordan Waller has been deemed a candidate for a follow-up tele-health visit to limit community exposure during the Covid-19 pandemic. I spoke with the patient via phone to ensure availability of phone/video source, confirm preferred email & phone number, and discuss instructions and expectations.  I reminded Jordan Waller to be prepared with any vital sign and/or heart rhythm information that could potentially be obtained via home monitoring, at the time of his visit. I reminded Jordan Waller to expect a phone call prior to his visit.  [name], [place] 12/28/2018 12:48 PM   FULL LENGTH CONSENT FOR  TELE-HEALTH VISIT   I hereby voluntarily request, consent and authorize [facility] and its employed or contracted physicians, physician assistants, nurse practitioners or other licensed health care professionals (the Practitioner), to provide me with telemedicine health care services (the "Services") as deemed necessary by the treating Practitioner. I acknowledge and consent to receive the Services by the Practitioner via telemedicine. I understand that the telemedicine visit will involve communicating with the Practitioner through live audiovisual communication technology and the disclosure of certain medical information by electronic transmission. I acknowledge that I have been given the opportunity to request an in-person assessment or other available alternative prior to the telemedicine visit and am voluntarily participating in the telemedicine visit.  I understand that I have the right to withhold or withdraw my consent to the use of telemedicine in the course of my care at any time, without affecting my right to future care or treatment, and that the Practitioner or I may terminate the telemedicine visit at any time. I understand that I have the right to inspect all information obtained and/or recorded in the course of the telemedicine visit and may receive copies of available information for a reasonable fee.  I understand that some of the potential risks of receiving the Services via telemedicine include:  Marland Kitchen Delay or interruption in medical evaluation due to technological equipment failure or disruption; . Information transmitted may not be sufficient (e.g. poor resolution of images) to allow for appropriate medical decision making by  the Practitioner; and/or  . In rare instances, security protocols could fail, causing a breach of personal health information.  Furthermore, I acknowledge that it is my responsibility to provide information about my medical history, conditions and care that is  complete and accurate to the best of my ability. I acknowledge that Practitioner's advice, recommendations, and/or decision may be based on factors not within their control, such as incomplete or inaccurate data provided by me or distortions of diagnostic images or specimens that may result from electronic transmissions. I understand that the practice of medicine is not an exact science and that Practitioner makes no warranties or guarantees regarding treatment outcomes. I acknowledge that I will receive a copy of this consent concurrently upon execution via email to the email address I last provided but may also request a printed copy by calling the office of [place].    I understand that my insurance will be billed for this visit.   I have read or had this consent read to me. . I understand the contents of this consent, which adequately explains the benefits and risks of the Services being provided via telemedicine.  . I have been provided ample opportunity to ask questions regarding this consent and the Services and have had my questions answered to my satisfaction. . I give my informed consent for the services to be provided through the use of telemedicine in my medical care  By participating in this telemedicine visit I agree to the above.

## 2018-12-29 ENCOUNTER — Other Ambulatory Visit: Payer: Self-pay

## 2018-12-29 ENCOUNTER — Telehealth (INDEPENDENT_AMBULATORY_CARE_PROVIDER_SITE_OTHER): Payer: Commercial Managed Care - PPO | Admitting: Physician Assistant

## 2018-12-29 DIAGNOSIS — I25810 Atherosclerosis of coronary artery bypass graft(s) without angina pectoris: Secondary | ICD-10-CM | POA: Diagnosis not present

## 2018-12-29 DIAGNOSIS — E7800 Pure hypercholesterolemia, unspecified: Secondary | ICD-10-CM

## 2018-12-29 DIAGNOSIS — R252 Cramp and spasm: Secondary | ICD-10-CM

## 2018-12-29 DIAGNOSIS — Z7189 Other specified counseling: Secondary | ICD-10-CM

## 2018-12-29 NOTE — Patient Instructions (Signed)
Medication Instructions:   Your physician recommends that you continue on your current medications as directed. Please refer to the Current Medication list given to you today.   If you need a refill on your cardiac medications before your next appointment, please call your pharmacy.   Lab work:  CMET ;LIPIDS AND MAG   If you have labs (blood work) drawn today and your tests are completely normal, you will receive your results only by: Marland Kitchen MyChart Message (if you have MyChart) OR . A paper copy in the mail If you have any lab test that is abnormal or we need to change your treatment, we will call you to review the results.  Testing/Procedures: NONE ORDERED  TODAY  Follow-Up: At [facility], you and your health needs are our priority.  As part of our continuing mission to provide you with exceptional heart care, we have created designated Provider Care Teams.  These Care Teams include your primary Cardiologist (physician) and Advanced Practice Providers (APPs -  Physician Assistants and Nurse Practitioners) who all work together to provide you with the care you need, when you need it. You will need a follow up appointment in 1 years.  Please call our office 2 months in advance to schedule this appointment.  You may see Lauree Chandler, MD or one of the following Advanced Practice Providers on your designated Care Team:   [place], PA-C Melina Copa, PA-C . Ermalinda Barrios, PA-C  Any Other Special Instructions Will Be Listed Below (If Applicable).

## 2018-12-29 NOTE — Progress Notes (Signed)
Virtual Visit via Telephone Note   This visit type was conducted due to national recommendations for restrictions regarding the COVID-19 Pandemic (e.g. social distancing) in an effort to limit this patient's exposure and mitigate transmission in our community.  Due to his co-morbid illnesses, this patient is at least at moderate risk for complications without adequate follow up.  This format is felt to be most appropriate for this patient at this time.  The patient did not have access to video technology/had technical difficulties with video requiring transitioning to audio format only (telephone).  All issues noted in this document were discussed and addressed.  No physical exam could be performed with this format.  Please refer to the patient's chart for his  consent to telehealth for [facility].   Date:  12/29/2018   ID:  Jordan Waller, DOB 1955-10-04, MRN [id number]  Patient Location: Home Provider Location: Home  PCP:  Lawerance Cruel, MD  Cardiologist:  Lauree Chandler, MD   Electrophysiologist:  None   Evaluation Performed:  Follow-Up Visit  Chief Complaint:  FU on CAD  History of Present Illness:    Jordan Waller is a 63 y.o. male with:   CAD  NSTEMI in setting of DKA 02/2012:  DES to mid LAD  Diabetes  Hyperlipidemia   Ex tobacco smoker  He was last seen by Dr. Angelena Form in 09/2017.   Since last seen, he has done well.  He has not had any anginal symptoms.  He works out on a regular basis without any significant symptoms.  He has not had shortness of breath, syncope, leg swelling.  He has had some leg cramps recently.  He has not had claudication.  The patient does not have symptoms concerning for COVID-19 infection (fever, chills, cough, or new shortness of breath).    Past Medical History:  Diagnosis Date  . CAD (coronary artery disease)    NSTEMI August 2013, DES mid LAD  . Diabetes mellitus   . High cholesterol    Past Surgical History:   Procedure Laterality Date  . LEFT HEART CATHETERIZATION WITH CORONARY ANGIOGRAM N/A 03/04/2012   Procedure: LEFT HEART CATHETERIZATION WITH CORONARY ANGIOGRAM;  Surgeon: Burnell Blanks, MD;  Location: [facility] CATH LAB;  Service: Cardiovascular;  Laterality: N/A;  . ROTATOR CUFF REPAIR    . TONSILLECTOMY AND ADENOIDECTOMY       Current Meds  Medication Sig  . aspirin EC 81 MG tablet Take 81 mg by mouth daily.  Marland Kitchen b complex vitamins capsule Take 1 capsule by mouth daily.  . BD PEN NEEDLE NANO U/F 32G X 4 MM MISC as directed.  . clopidogrel (PLAVIX) 75 MG tablet TAKE 1 TABLET BY MOUTH EVERY DAY  . doxycycline (VIBRA-TABS) 100 MG tablet Take 100 mg by mouth 2 (two) times daily.   . insulin aspart (NOVOLOG) 100 UNIT/ML injection Inject 4-13 Units into the skin 3 (three) times daily with meals. Sliding scale: If blood glucose is 70 to 130: take 4 units at beginning of meal.  CBG 131 to 160: 5 units.   161 to 190: 6 units.   191 to 220: 7 units.   221 to 250: 8 units.   251 to 280:  9 units.   281 to 310: then 10 units.   311 to 340: 11 units.   341 to 370: 12 units.   371 to 400: 13 units.   Over 400 mg/dL: 13 units and notify your MD  . insulin  glargine (LANTUS) 100 UNIT/ML injection Inject 15 Units into the skin at bedtime. Reported on 10/11/2015  . Insulin Pen Needle 32G X 4 MM MISC by Does not apply route.  Marland Kitchen lisinopril (PRINIVIL,ZESTRIL) 2.5 MG tablet Take 2.5 mg by mouth daily.  . metoprolol tartrate (LOPRESSOR) 25 MG tablet TAKE 1 TABLET BY MOUTH TWICE A DAY  . nitroGLYCERIN (NITROSTAT) 0.4 MG SL tablet Place 0.4 mg under the tongue every 5 (five) minutes as needed for chest pain. Up to 3 times then call 911  . NOVOLOG 100 UNIT/ML injection BASAL(UNITS/HOUR)12AM 0.9,CARB RATIO 10GRAMS/UNIT,SENSITIVITY 40MG /DL PER UNIT,TARGET 100MG /ML DAILY  . rosuvastatin (CRESTOR) 10 MG tablet Take 1 tablet (10 mg total) by mouth daily. Please keep upcoming appt in May for future refills. Thank you      Allergies:   Patient has no known allergies.   Social History   Tobacco Use  . Smoking status: Former Smoker    Packs/day: 0.50    Years: 20.00    Pack years: 10.00  Substance Use Topics  . Alcohol use: Yes    Alcohol/week: 1.0 standard drinks    Types: 1 drink(s) per week    Comment: 2-4 drinks per day  . Drug use: No     Family Hx: The patient's family history includes Colon cancer in his mother and sister; Diabetes type II in his mother. There is no history of Coronary artery disease.  ROS:   Please see the history of present illness.    All other systems reviewed and are negative.   Prior CV studies:   The following studies were reviewed today:  ETT 06/26/16  Blood pressure demonstrated a hypertensive response to exercise.  There was no ST segment deviation noted during stress.  Negative exercise tolerance test at an adequate heart rate of 101% MPHR. The patient was asymptomatic.  Excellent exercise capacity at 11.7 mets.  This is a low risk study.  Echo 08/27/12 - Left ventricle: The cavity size was normal. Wall thickness  was normal. Systolic function was normal. The estimated  ejection fraction was in the range of 60% to 65%. Doppler  parameters are consistent with abnormal left ventricular  relaxation (grade 1 diastolic dysfunction).  - Mitral valve: Calcified annulus. Mildly thickened leaflets   Cardiac Catheterization 03/04/12 LM normal  LAD ost 25, mid 99 RCA irregs, PDA ost 20 EF 40-45 with ant/apical HK PCI:  3 x 16 mm Promus DES to mid LAD   Labs/Other Tests and Data Reviewed:    EKG:  No ECG reviewed.  Recent Labs: No results found for requested labs within last 8760 hours.   Recent Lipid Panel Lab Results  Component Value Date/Time   CHOL 147 05/08/2015 08:39 AM   TRIG 71 05/08/2015 08:39 AM   HDL 54 05/08/2015 08:39 AM   CHOLHDL 2.7 05/08/2015 08:39 AM   LDLCALC 79 05/08/2015 08:39 AM     Wt Readings from Last 3 Encounters:   10/16/17 165 lb (74.8 kg)  06/11/16 168 lb 6.4 oz (76.4 kg)  02/12/15 162 lb (73.5 kg)     Objective:    Vital Signs:  There were no vitals taken for this visit.   VITAL SIGNS:  reviewed GEN:  no acute distress RESPIRATORY:  No labored breathing NEURO:  Alert and oriented PSYCH:  Mood is good  ASSESSMENT & PLAN:     Coronary artery disease involving coronary bypass graft of native heart without angina pectoris History of non-ST elevation myocardial infarction in  2013 in the setting of diabetic ketoacidosis.  He underwent PCI with a DES to the LAD.  He is currently doing well without anginal symptoms.  Continue current medical regimen.  Of note, he travels frequently to [place] for his job.  He is there for several months at a time.  He did request a letter to allow his medications to be shipped to him while he is in [place].  I have prepared this for him and he can either print it out through my chart or come by our office and pick it up.  Pure hypercholesterolemia LDL optimal on most recent lab work.  Continue current Rx.  Arrange fasting CMET, lipid panel  Leg cramps Obtain CMET, magnesium.  He sees his endocrinologist on 12/31/2018.  We will try to have his labs obtained at that visit and faxed to me.  If this cannot be done, we will arrange a lab visit in our office.  COVID-19 Education: The signs and symptoms of COVID-19 were discussed with the patient and how to seek care for testing (follow up with PCP or arrange E-visit).  The importance of social distancing was discussed today.  Time:   Today, I have spent 8 minutes with the patient with telehealth technology discussing the above problems.     Medication Adjustments/Labs and Tests Ordered: Current medicines are reviewed at length with the patient today.  Concerns regarding medicines are outlined above.   Tests Ordered: No orders of the defined types were placed in this encounter.   Medication Changes: No orders of  the defined types were placed in this encounter.   Disposition:  Follow up in 1 year(s) with Dr. Angelena Form  Signed, Richardson Dopp, PA-C  12/29/2018 9:04 AM    [place]

## 2019-02-04 ENCOUNTER — Other Ambulatory Visit: Payer: Self-pay | Admitting: Cardiovascular Disease

## 2019-02-15 ENCOUNTER — Other Ambulatory Visit: Payer: Self-pay | Admitting: Cardiovascular Disease

## 2019-03-06 ENCOUNTER — Observation Stay (HOSPITAL_COMMUNITY)
Admission: EM | Admit: 2019-03-06 | Discharge: 2019-03-07 | Disposition: A | Discharge status: Home / Self Care | Payer: Commercial Managed Care - PPO | Attending: Internal Medicine | Admitting: Internal Medicine

## 2019-03-06 ENCOUNTER — Encounter (HOSPITAL_COMMUNITY): Payer: Self-pay | Admitting: Internal Medicine

## 2019-03-06 ENCOUNTER — Other Ambulatory Visit: Payer: Self-pay

## 2019-03-06 DIAGNOSIS — Z20828 Contact with and (suspected) exposure to other viral communicable diseases: Secondary | ICD-10-CM | POA: Diagnosis not present

## 2019-03-06 DIAGNOSIS — I2510 Atherosclerotic heart disease of native coronary artery without angina pectoris: Secondary | ICD-10-CM | POA: Diagnosis present

## 2019-03-06 DIAGNOSIS — E785 Hyperlipidemia, unspecified: Secondary | ICD-10-CM | POA: Diagnosis present

## 2019-03-06 DIAGNOSIS — Z7901 Long term (current) use of anticoagulants: Secondary | ICD-10-CM | POA: Insufficient documentation

## 2019-03-06 DIAGNOSIS — Z87891 Personal history of nicotine dependence: Secondary | ICD-10-CM | POA: Diagnosis not present

## 2019-03-06 DIAGNOSIS — E1110 Type 2 diabetes mellitus with ketoacidosis without coma: Secondary | ICD-10-CM | POA: Diagnosis present

## 2019-03-06 DIAGNOSIS — Z79899 Other long term (current) drug therapy: Secondary | ICD-10-CM | POA: Diagnosis not present

## 2019-03-06 DIAGNOSIS — I10 Essential (primary) hypertension: Secondary | ICD-10-CM | POA: Insufficient documentation

## 2019-03-06 DIAGNOSIS — L739 Follicular disorder, unspecified: Secondary | ICD-10-CM | POA: Diagnosis not present

## 2019-03-06 DIAGNOSIS — Z794 Long term (current) use of insulin: Secondary | ICD-10-CM | POA: Insufficient documentation

## 2019-03-06 DIAGNOSIS — R1011 Right upper quadrant pain: Secondary | ICD-10-CM | POA: Diagnosis present

## 2019-03-06 DIAGNOSIS — E1010 Type 1 diabetes mellitus with ketoacidosis without coma: Principal | ICD-10-CM | POA: Insufficient documentation

## 2019-03-06 DIAGNOSIS — N179 Acute kidney failure, unspecified: Secondary | ICD-10-CM | POA: Diagnosis present

## 2019-03-06 DIAGNOSIS — E7800 Pure hypercholesterolemia, unspecified: Secondary | ICD-10-CM | POA: Insufficient documentation

## 2019-03-06 HISTORY — DX: Essential (primary) hypertension: I10

## 2019-03-06 HISTORY — DX: Follicular disorder, unspecified: L73.9

## 2019-03-06 LAB — COMPREHENSIVE METABOLIC PANEL
ALT: 36 U/L (ref 0–44)
AST: 36 U/L (ref 15–41)
Albumin: 4.7 g/dL (ref 3.5–5.0)
Alkaline Phosphatase: 141 U/L — ABNORMAL HIGH (ref 38–126)
Anion gap: 27 — ABNORMAL HIGH (ref 5–15)
BUN: 28 mg/dL — ABNORMAL HIGH (ref 8–23)
CO2: 7 mmol/L — ABNORMAL LOW (ref 22–32)
Calcium: 9.2 mg/dL (ref 8.9–10.3)
Chloride: 99 mmol/L (ref 98–111)
Creatinine, Ser: 1.98 mg/dL — ABNORMAL HIGH (ref 0.61–1.24)
GFR calc Af Amer: 40 mL/min — ABNORMAL LOW (ref >60)
GFR calc non Af Amer: 34 mL/min — ABNORMAL LOW (ref >60)
Glucose, Bld: 431 mg/dL — ABNORMAL HIGH (ref 70–99)
Potassium: 5.1 mmol/L (ref 3.5–5.1)
Sodium: 133 mmol/L — ABNORMAL LOW (ref 135–145)
Total Bilirubin: 1.8 mg/dL — ABNORMAL HIGH (ref 0.3–1.2)
Total Protein: 7.5 g/dL (ref 6.5–8.1)

## 2019-03-06 LAB — BASIC METABOLIC PANEL
Anion gap: 16 — ABNORMAL HIGH (ref 5–15)
Anion gap: 6 (ref 5–15)
BUN: 24 mg/dL — ABNORMAL HIGH (ref 8–23)
BUN: 18 mg/dL (ref 8–23)
CO2: 13 mmol/L — ABNORMAL LOW (ref 22–32)
CO2: 20 mmol/L — ABNORMAL LOW (ref 22–32)
Calcium: 8.1 mg/dL — ABNORMAL LOW (ref 8.9–10.3)
Calcium: 7.8 mg/dL — ABNORMAL LOW (ref 8.9–10.3)
Chloride: 106 mmol/L (ref 98–111)
Chloride: 110 mmol/L (ref 98–111)
Creatinine, Ser: 1.38 mg/dL — ABNORMAL HIGH (ref 0.61–1.24)
Creatinine, Ser: 1.05 mg/dL (ref 0.61–1.24)
GFR calc Af Amer: >60 mL/min (ref >60)
GFR calc Af Amer: >60 mL/min (ref >60)
GFR calc non Af Amer: 53 mL/min — ABNORMAL LOW (ref >60)
GFR calc non Af Amer: >60 mL/min (ref >60)
Glucose, Bld: 274 mg/dL — ABNORMAL HIGH (ref 70–99)
Glucose, Bld: 109 mg/dL — ABNORMAL HIGH (ref 70–99)
Potassium: 4.7 mmol/L (ref 3.5–5.1)
Potassium: 3.9 mmol/L (ref 3.5–5.1)
Sodium: 135 mmol/L (ref 135–145)
Sodium: 136 mmol/L (ref 135–145)

## 2019-03-06 LAB — SARS CORONAVIRUS 2 BY RT PCR (HOSPITAL ORDER, PERFORMED IN ~~LOC~~ HOSPITAL LAB): SARS Coronavirus 2: NEGATIVE

## 2019-03-06 LAB — CBG MONITORING, ED
Glucose-Capillary: 426 mg/dL — ABNORMAL HIGH (ref 70–99)
Glucose-Capillary: 280 mg/dL — ABNORMAL HIGH (ref 70–99)
Glucose-Capillary: 247 mg/dL — ABNORMAL HIGH (ref 70–99)
Glucose-Capillary: 247 mg/dL — ABNORMAL HIGH (ref 70–99)
Glucose-Capillary: 235 mg/dL — ABNORMAL HIGH (ref 70–99)
Glucose-Capillary: 193 mg/dL — ABNORMAL HIGH (ref 70–99)
Glucose-Capillary: 181 mg/dL — ABNORMAL HIGH (ref 70–99)
Glucose-Capillary: 164 mg/dL — ABNORMAL HIGH (ref 70–99)
Glucose-Capillary: 114 mg/dL — ABNORMAL HIGH (ref 70–99)
Glucose-Capillary: 108 mg/dL — ABNORMAL HIGH (ref 70–99)
Glucose-Capillary: 94 mg/dL (ref 70–99)

## 2019-03-06 LAB — URINALYSIS, ROUTINE W REFLEX MICROSCOPIC
Bacteria, UA: NONE SEEN
Bilirubin Urine: NEGATIVE
Glucose, UA: >=500 mg/dL — AB
Hgb urine dipstick: NEGATIVE
Ketones, ur: 80 mg/dL — AB
Leukocytes,Ua: NEGATIVE
Nitrite: NEGATIVE
Protein, ur: NEGATIVE mg/dL
Specific Gravity, Urine: 1.02 (ref 1.005–1.030)
pH: 5 (ref 5.0–8.0)

## 2019-03-06 LAB — CBC WITH DIFFERENTIAL/PLATELET
Abs Immature Granulocytes: 0.84 10*3/uL — ABNORMAL HIGH (ref 0.00–0.07)
Basophils Absolute: 0.2 10*3/uL — ABNORMAL HIGH (ref 0.0–0.1)
Basophils Relative: 1 %
Eosinophils Absolute: 0.1 10*3/uL (ref 0.0–0.5)
Eosinophils Relative: 0 %
HCT: 51.3 % (ref 39.0–52.0)
Hemoglobin: 16.6 g/dL (ref 13.0–17.0)
Immature Granulocytes: 3 %
Lymphocytes Relative: 8 %
Lymphs Abs: 2.5 10*3/uL (ref 0.7–4.0)
MCH: 32 pg (ref 26.0–34.0)
MCHC: 32.4 g/dL (ref 30.0–36.0)
MCV: 99 fL (ref 80.0–100.0)
Monocytes Absolute: 3 10*3/uL — ABNORMAL HIGH (ref 0.1–1.0)
Monocytes Relative: 9 %
Neutro Abs: 26 10*3/uL — ABNORMAL HIGH (ref 1.7–7.7)
Neutrophils Relative %: 79 %
Platelets: 305 10*3/uL (ref 150–400)
RBC: 5.18 MIL/uL (ref 4.22–5.81)
RDW: 11.8 % (ref 11.5–15.5)
WBC: 32.6 10*3/uL — ABNORMAL HIGH (ref 4.0–10.5)
nRBC: 0 % (ref 0.0–0.2)

## 2019-03-06 LAB — POCT I-STAT EG7
Acid-base deficit: 22 mmol/L — ABNORMAL HIGH (ref 0.0–2.0)
Bicarbonate: 9.1 mmol/L — ABNORMAL LOW (ref 20.0–28.0)
Calcium, Ion: 1.18 mmol/L (ref 1.15–1.40)
HCT: 54 % — ABNORMAL HIGH (ref 39.0–52.0)
Hemoglobin: 18.4 g/dL — ABNORMAL HIGH (ref 13.0–17.0)
O2 Saturation: 95 %
Potassium: 5 mmol/L (ref 3.5–5.1)
Sodium: 133 mmol/L — ABNORMAL LOW (ref 135–145)
TCO2: 10 mmol/L — ABNORMAL LOW (ref 22–32)
pCO2, Ven: 39.5 mmHg — ABNORMAL LOW (ref 44.0–60.0)
pH, Ven: 6.972 — CL (ref 7.250–7.430)
pO2, Ven: 117 mmHg — ABNORMAL HIGH (ref 32.0–45.0)

## 2019-03-06 LAB — LIPASE, BLOOD: Lipase: 18 U/L (ref 11–51)

## 2019-03-06 LAB — GLUCOSE, CAPILLARY
Glucose-Capillary: 88 mg/dL (ref 70–99)
Glucose-Capillary: 98 mg/dL (ref 70–99)

## 2019-03-06 LAB — HEMOGLOBIN A1C
Hgb A1c MFr Bld: 10.2 % — ABNORMAL HIGH (ref 4.8–5.6)
Mean Plasma Glucose: 246.04 mg/dL

## 2019-03-06 LAB — LACTIC ACID, PLASMA
Lactic Acid, Venous: 4.1 mmol/L (ref 0.5–1.9)
Lactic Acid, Venous: 2.6 mmol/L (ref 0.5–1.9)

## 2019-03-06 MED ORDER — DOXYCYCLINE HYCLATE 100 MG PO TABS
100.0000 mg | ORAL_TABLET | Freq: Two times a day (BID) | ORAL | Status: DC
  Administered 2019-03-06 – 2019-03-07 (×3): 100 mg via ORAL
  Filled 2019-03-06 (×3): qty 1

## 2019-03-06 MED ORDER — DEXTROSE-NACL 5-0.45 % IV SOLN: INTRAVENOUS | Status: DC

## 2019-03-06 MED ORDER — CLOPIDOGREL BISULFATE 75 MG PO TABS
75.0000 mg | ORAL_TABLET | Freq: Every day | ORAL | Status: DC
  Administered 2019-03-06 – 2019-03-07 (×2): 75 mg via ORAL
  Filled 2019-03-06 (×2): qty 1

## 2019-03-06 MED ORDER — DEXTROSE-NACL 5-0.45 % IV SOLN
INTRAVENOUS | Status: DC
  Administered 2019-03-06 – 2019-03-07 (×3): via INTRAVENOUS

## 2019-03-06 MED ORDER — ASPIRIN EC 81 MG PO TBEC
81.0000 mg | DELAYED_RELEASE_TABLET | Freq: Every day | ORAL | Status: DC
  Administered 2019-03-06 – 2019-03-07 (×2): 81 mg via ORAL
  Filled 2019-03-06 (×2): qty 1

## 2019-03-06 MED ORDER — SODIUM CHLORIDE 0.9 % IV BOLUS
1000.0000 mL | Freq: Once | INTRAVENOUS | Status: AC
  Administered 2019-03-06: 1000 mL via INTRAVENOUS

## 2019-03-06 MED ORDER — ROSUVASTATIN CALCIUM 5 MG PO TABS
10.0000 mg | ORAL_TABLET | Freq: Every day | ORAL | Status: DC
  Administered 2019-03-06 – 2019-03-07 (×2): 10 mg via ORAL
  Filled 2019-03-06 (×2): qty 2

## 2019-03-06 MED ORDER — ONDANSETRON HCL 4 MG/2ML IJ SOLN
4.0000 mg | Freq: Once | INTRAMUSCULAR | Status: AC
  Administered 2019-03-06: 10:00:00 4 mg via INTRAVENOUS
  Filled 2019-03-06: qty 2

## 2019-03-06 MED ORDER — POTASSIUM CHLORIDE 10 MEQ/100ML IV SOLN: 10.0000 meq | INTRAVENOUS | Status: DC

## 2019-03-06 MED ORDER — INSULIN REGULAR(HUMAN) IN NACL 100-0.9 UT/100ML-% IV SOLN
INTRAVENOUS | Status: DC
  Administered 2019-03-06: 3.7 [IU]/h via INTRAVENOUS
  Filled 2019-03-06: qty 100

## 2019-03-06 MED ORDER — SODIUM CHLORIDE 0.9 % IV SOLN
INTRAVENOUS | Status: DC
  Administered 2019-03-06: 12:00:00 via INTRAVENOUS

## 2019-03-06 MED ORDER — INSULIN REGULAR(HUMAN) IN NACL 100-0.9 UT/100ML-% IV SOLN
INTRAVENOUS | Status: DC
  Administered 2019-03-06: 2.2 [IU]/h via INTRAVENOUS

## 2019-03-06 MED ORDER — METOPROLOL TARTRATE 25 MG PO TABS
25.0000 mg | ORAL_TABLET | Freq: Two times a day (BID) | ORAL | Status: DC
  Filled 2019-03-06: qty 1

## 2019-03-06 MED ORDER — SODIUM CHLORIDE 0.9 % IV SOLN: INTRAVENOUS | Status: AC

## 2019-03-06 MED ORDER — SODIUM CHLORIDE 0.9 % IV SOLN: INTRAVENOUS | Status: DC

## 2019-03-06 NOTE — H&P (Signed)
History and Physical    Jordan Waller:[id number] DOB: 05/16/1956 DOA: 03/06/2019  PCP: Lawerance Cruel, MD Consultants:  Buddy Duty - endocrinology; Angelena Form - Cardiology Patient coming from:  Home - lives with wife; [facility] Wife, [phone]  Chief Complaint: DKA  HPI: Jordan Waller is a 62 y.o. male with medical history significant of DM; CAD s/p stent 2013; and HLD presenting with concern for DKA. He went to visit his daughter in [place] for the weekend.  He did not pack enough insulin and ran out at 10AM Saturday AM.  Symptoms started about 0230.  He was having n/v, tingling, very thirsty, polydipsia.  His wife could smell the acetone when he walked in.  He drove himself home 3 1/2 hours.  No fever or concern for COVID exposure.   ED Course:  Has insulin pod, ran out of insulin at 10AM.  At 0200, awoke with diarrhea, vomiting.  Drove home and glucose too high to read.  Back to 426 here in ER.  PH 6.972, WBC 32.6.  CO2 7, gap 27.  AKI, Creatinine 1.98.  Lactate 4.1.  Review of Systems: As per HPI; otherwise review of systems reviewed and negative.   Ambulatory Status:  Ambulates without assistance  Past Medical History:  Diagnosis Date  . CAD (coronary artery disease)    NSTEMI August 2013, DES mid LAD  . Chronic folliculitis   . Diabetes mellitus 2012   on insulin pump throughout  . High cholesterol   . Hypertension     Past Surgical History:  Procedure Laterality Date  . LEFT HEART CATHETERIZATION WITH CORONARY ANGIOGRAM N/A 03/04/2012   Procedure: LEFT HEART CATHETERIZATION WITH CORONARY ANGIOGRAM;  Surgeon: Burnell Blanks, MD;  Location: [facility] CATH LAB;  Service: Cardiovascular;  Laterality: N/A;  . ROTATOR CUFF REPAIR    . TONSILLECTOMY AND ADENOIDECTOMY      Social History   Socioeconomic History  . Marital status: Married    Spouse name: Not on file  . Number of children: 3  . Years of education: Not on file  . Highest education level: Not on file   Occupational History  . Occupation: Metallurgist: [employer]  Social Needs  . Financial resource strain: Not on file  . Food insecurity    Worry: Not on file    Inability: Not on file  . Transportation needs    Medical: Not on file    Non-medical: Not on file  Tobacco Use  . Smoking status: Former Smoker    Packs/day: 0.50    Years: 20.00    Pack years: 10.00    Quit date: 2012    Years since quitting: 8.6  . Smokeless tobacco: Never Used  Substance and Sexual Activity  . Alcohol use: Yes    Alcohol/week: 1.0 standard drinks    Types: 1 Standard drinks or equivalent per week    Comment: 2-4 drinks 3 times a week  . Drug use: No  . Sexual activity: Not on file  Lifestyle  . Physical activity    Days per week: Not on file    Minutes per session: Not on file  . Stress: Not on file  Relationships  . Social Herbalist on phone: Not on file    Gets together: Not on file    Attends religious service: Not on file    Active member of club or organization: Not on file    Attends meetings  of clubs or organizations: Not on file    Relationship status: Not on file  . Intimate partner violence    Fear of current or ex partner: Not on file    Emotionally abused: Not on file    Physically abused: Not on file    Forced sexual activity: Not on file  Other Topics Concern  . Not on file  Social History Narrative  . Not on file    No Known Allergies  Family History  Problem Relation Age of Onset  . Colon cancer Mother   . Diabetes type II Mother   . Colon cancer Sister   . Coronary artery disease Neg Hx        No first degree relatives with CAD    Prior to Admission medications   Medication Sig Start Date End Date Taking? Authorizing Provider  aspirin EC 81 MG tablet Take 81 mg by mouth daily.    [provider]  b complex vitamins capsule Take 1 capsule by mouth daily.    [provider]  BD PEN NEEDLE NANO U/F 32G X 4  MM MISC as directed. 03/26/12   [provider]  clopidogrel (PLAVIX) 75 MG tablet TAKE 1 TABLET BY MOUTH EVERY DAY 02/07/19   Burnell Blanks, MD  doxycycline (VIBRA-TABS) 100 MG tablet Take 100 mg by mouth 2 (two) times daily.  03/06/12   [provider]  insulin aspart (NOVOLOG) 100 UNIT/ML injection Inject 4-13 Units into the skin 3 (three) times daily with meals. Sliding scale: If blood glucose is 70 to 130: take 4 units at beginning of meal.  CBG 131 to 160: 5 units.   161 to 190: 6 units.   191 to 220: 7 units.   221 to 250: 8 units.   251 to 280:  9 units.   281 to 310: then 10 units.   311 to 340: 11 units.   341 to 370: 12 units.   371 to 400: 13 units.   Over 400 mg/dL: 13 units and notify your MD 03/06/12   Rai, Vernelle Emerald, MD  insulin glargine (LANTUS) 100 UNIT/ML injection Inject 15 Units into the skin at bedtime. Reported on 10/11/2015    [provider]  Insulin Pen Needle 32G X 4 MM MISC by Does not apply route.    [provider]  lisinopril (PRINIVIL,ZESTRIL) 2.5 MG tablet Take 2.5 mg by mouth daily. 01/23/15   [provider]  metoprolol tartrate (LOPRESSOR) 25 MG tablet TAKE 1 TABLET BY MOUTH TWICE A DAY 11/08/18   Burnell Blanks, MD  nitroGLYCERIN (NITROSTAT) 0.4 MG SL tablet Place 0.4 mg under the tongue every 5 (five) minutes as needed for chest pain. Up to 3 times then call 911    [provider]  NOVOLOG 100 UNIT/ML injection BASAL(UNITS/HOUR)12AM 0.9,CARB RATIO 10GRAMS/UNIT,SENSITIVITY 40MG /DL PER UNIT,TARGET 100MG /ML DAILY 01/24/15   [provider]  rosuvastatin (CRESTOR) 10 MG tablet TAKE 1 TABLET (10 MG TOTAL) BY MOUTH DAILY. PLEASE KEEP UPCOMING APPT IN MAY FOR FUTURE REFILLS. THANK YOU 02/15/19   Burnell Blanks, MD    Physical Exam: Vitals:   03/06/19 0915 03/06/19 0945 03/06/19 1030 03/06/19 1100  BP: 140/76 134/72 (!) 116/55 129/65  Pulse: 94 95 98 97  Resp: 16 16 11 14   Temp:       TempSrc:      SpO2: 100% 100% 100% 100%  Weight:      Height:         .  General:  Appears calm and comfortable and is NAD despite pH<7 . Eyes:  PERRL, EOMI, normal lids, iris . ENT:  grossly normal hearing, lips & tongue, mmm; appropriate dentition . Neck:  no LAD, masses or thyromegaly . Cardiovascular:  RRR, no m/r/g. No LE edema.  Marland Kitchen Respiratory:   CTA bilaterally with no wheezes/rales/rhonchi.  Normal respiratory effort. . Abdomen:  soft, NT, ND, NABS . Skin:  no rash or induration seen on limited exam . Musculoskeletal:  grossly normal tone BUE/BLE, good ROM, no bony abnormality . Lower extremity:  No LE edema.  Limited foot exam with no ulcerations.  2+ distal pulses. Marland Kitchen Psychiatric:  grossly normal mood and affect, speech fluent and appropriate, AOx3 . Neurologic:  CN 2-12 grossly intact, moves all extremities in coordinated fashion, sensation intact    Radiological Exams on Admission: No results found.  EKG: not done   Labs on Admission: I have personally reviewed the available labs and imaging studies at the time of the admission.  Pertinent labs:   VBG: 6.972/39.5/117.0/9.1 Na++ 133 K+ 5.1 CO2 7 Glucose 431 BUN 28/Creatinine 1.98/GFR 34 Anion gap 27 AP 141 Bili 1.8 Lactate 4.1 WBC 32.6  Assessment/Plan Principal Problem:   DKA (diabetic ketoacidosis) (HCC) Active Problems:   Coronary artery disease involving native coronary artery of native heart without angina pectoris   Dyslipidemia   Chronic folliculitis   AKI (acute kidney injury) ([place])   DKA -He is on a baseline insulin "pod" (pump) but also has Lantus as a back-up for this  -He ran out of insulin while visiting his daughter out of state yesterday AM and became symptomatic overnight -Despite DKA, he drove himself home 3 hours and went to his house to administer insulin -Profound DKA by labs, but patient appeared remarkably lucid and conversant -No indication of illness as source -Will admit to  SDU with DKA protocol -Would recommend continuing insulin drip at least until morning regardless of rapidity of closure of gap and normalization of labs -K+ slightly increased at time of presentation so no potassium supplementation added -IVF at 150 cc/hr, NS until glucose <250 and then decrease rate to 125 and change to D51/2NS -Discontinue insulin pump for now -Significant counseling provided regarding the importance of always taking extra insulin with him - [place] (his son had to drive 5 hours to bring him insulin) and also required hospitalization in [place] for the same issue  Acute kidney injury -Likely associated with severe dehydration associated with DKA -Aggressive hydration as above -Following BMP q4h  CAD -Continue ASA, Plavix  HTN -Hold Lisinopril due to renal dysfunction  HLD -Continue Crestor  Folliculitis -Long standing use of doxy, will continue   Note: This patient has been tested and is pending for the novel coronavirus COVID-19.  Patient works for a company based on [place] and travels there for 4 months at a time, returning only for about 6 weeks.  He returned home in June (2020) and has been unable to return thus far due to the COVID-19 pandemic.  He appears to travel domestically and so may be at increased risk for COVID.  Will keep as a PUI at this time pending Cepheid results.   DVT prophylaxis:   Lovenox  Code Status:  Full - confirmed with patient/family Family Communication: Wife was present throughout evaluation  Disposition Plan:  Home once clinically improved Consults called: None  Admission status: It is my clinical opinion that  referral for OBSERVATION is reasonable and necessary in this patient based on the above information provided. The aforementioned taken together are felt to place the patient at high risk for further clinical deterioration. However it is anticipated that the patient may be medically  stable for discharge from the hospital within 24 to 48 hours.    Karmen Bongo MD [facility]   How to contact the [facility] Attending or Consulting provider [place] or covering provider during after hours [place], for this patient?  1. Check the care team in [facility] and look for a) attending/consulting TRH provider listed and b) the [facility] team listed 2. Log into [email or web address] and use [place]'s universal password to access. If you do not have the password, please contact the hospital operator. 3. Locate the [facility] provider you are looking for under [facility] and page to a number that you can be directly reached. 4. If you still have difficulty reaching the provider, please page the [facility] (Director on Call) for the Hospitalists listed on amion for assistance.   03/06/2019, 11:22 AM

## 2019-03-06 NOTE — ED Provider Notes (Signed)
[place] EMERGENCY DEPARTMENT Provider Note   CSN: [id number] Arrival date & time: 03/06/19  0845    History   Chief Complaint Chief Complaint  Patient presents with  . Diabetic Ketoacidosis    HPI Jordan Waller is a 63 y.o. male.     63yo male with history of insuline dependent diabetes (DKA 2013, 2016), CAD, high cholesterol, insulin pod in place on right flank presents with complaint of high blood sugar. Patient states he was out of town in [place] visiting family when he ran out of his insulin around [place] yesterday. Patient thought he would be fine to wait until returning to [facility] for more insulin however woke up at 2AM today with nausea/vomiting/diarrhea, polyuria and polydipsia. Patient drove 4 hours home, refilled his pod with insulin and came to the ER. Reports initial glucose prior to restarting his insulin was too high for the meter to read, currently 426. Denies blood in emesis or stools, fevers, chills, chest pain, shortness of breath, abdominal pain. No other complaints or concerns.     Past Medical History:  Diagnosis Date  . CAD (coronary artery disease)    NSTEMI August 2013, DES mid LAD  . Diabetes mellitus   . High cholesterol     Patient Active Problem List   Diagnosis Date Noted  . Coronary artery disease involving native coronary artery of native heart without angina pectoris 12/14/2018  . Hx of NSTEMI in 2013 tx with DES to LAD 03/02/2012  . DKA (diabetic ketoacidoses) ([place]) 03/01/2012  . Metabolic acidosis 62/95/2841  . Nausea & vomiting 03/01/2012  . Diarrhea 03/01/2012    Past Surgical History:  Procedure Laterality Date  . LEFT HEART CATHETERIZATION WITH CORONARY ANGIOGRAM N/A 03/04/2012   Procedure: LEFT HEART CATHETERIZATION WITH CORONARY ANGIOGRAM;  Surgeon: Burnell Blanks, MD;  Location: [facility] CATH LAB;  Service: Cardiovascular;  Laterality: N/A;  . ROTATOR CUFF REPAIR    . TONSILLECTOMY AND ADENOIDECTOMY           Home Medications    Prior to Admission medications   Medication Sig Start Date End Date Taking? Authorizing Provider  aspirin EC 81 MG tablet Take 81 mg by mouth daily.    [provider]  b complex vitamins capsule Take 1 capsule by mouth daily.    [provider]  BD PEN NEEDLE NANO U/F 32G X 4 MM MISC as directed. 03/26/12   [provider]  clopidogrel (PLAVIX) 75 MG tablet TAKE 1 TABLET BY MOUTH EVERY DAY 02/07/19   Burnell Blanks, MD  doxycycline (VIBRA-TABS) 100 MG tablet Take 100 mg by mouth 2 (two) times daily.  03/06/12   [provider]  insulin aspart (NOVOLOG) 100 UNIT/ML injection Inject 4-13 Units into the skin 3 (three) times daily with meals. Sliding scale: If blood glucose is 70 to 130: take 4 units at beginning of meal.  CBG 131 to 160: 5 units.   161 to 190: 6 units.   191 to 220: 7 units.   221 to 250: 8 units.   251 to 280:  9 units.   281 to 310: then 10 units.   311 to 340: 11 units.   341 to 370: 12 units.   371 to 400: 13 units.   Over 400 mg/dL: 13 units and notify your MD 03/06/12   Rai, Vernelle Emerald, MD  insulin glargine (LANTUS) 100 UNIT/ML injection Inject 15 Units into the skin at bedtime. Reported on 10/11/2015  [provider]  Insulin Pen Needle 32G X 4 MM MISC by Does not apply route.    [provider]  lisinopril (PRINIVIL,ZESTRIL) 2.5 MG tablet Take 2.5 mg by mouth daily. 01/23/15   [provider]  metoprolol tartrate (LOPRESSOR) 25 MG tablet TAKE 1 TABLET BY MOUTH TWICE A DAY 11/08/18   Burnell Blanks, MD  nitroGLYCERIN (NITROSTAT) 0.4 MG SL tablet Place 0.4 mg under the tongue every 5 (five) minutes as needed for chest pain. Up to 3 times then call 911    [provider]  NOVOLOG 100 UNIT/ML injection BASAL(UNITS/HOUR)12AM 0.9,CARB RATIO 10GRAMS/UNIT,SENSITIVITY 40MG /DL PER UNIT,TARGET 100MG /ML DAILY 01/24/15   [provider]  rosuvastatin (CRESTOR) 10 MG tablet TAKE 1  TABLET (10 MG TOTAL) BY MOUTH DAILY. PLEASE KEEP UPCOMING APPT IN MAY FOR FUTURE REFILLS. THANK YOU 02/15/19   Burnell Blanks, MD    Family History Family History  Problem Relation Age of Onset  . Colon cancer Mother   . Diabetes type II Mother   . Colon cancer Sister   . Coronary artery disease Neg Hx        No first degree relatives with CAD    Social History Social History   Tobacco Use  . Smoking status: Former Smoker    Packs/day: 0.50    Years: 20.00    Pack years: 10.00  Substance Use Topics  . Alcohol use: Yes    Alcohol/week: 1.0 standard drinks    Types: 1 drink(s) per week    Comment: 2-4 drinks per day  . Drug use: No     Allergies   Patient has no known allergies.   Review of Systems Review of Systems  Constitutional: Negative for chills and fever.  Respiratory: Negative for shortness of breath.   Cardiovascular: Negative for chest pain.  Gastrointestinal: Positive for blood in stool, diarrhea, nausea and vomiting. Negative for abdominal pain and constipation.  Endocrine: Positive for polydipsia and polyuria.  Genitourinary: Positive for frequency. Negative for dysuria and hematuria.  Musculoskeletal: Negative for arthralgias and myalgias.  Skin: Negative for rash and wound.  Allergic/Immunologic: Positive for immunocompromised state.  Neurological: Negative for dizziness and weakness.  Psychiatric/Behavioral: Negative for confusion.  All other systems reviewed and are negative.    Physical Exam Updated Vital Signs BP 134/72   Pulse 95   Temp 97.6 F (36.4 C) (Oral)   Resp 16   Ht 5\' 7"  (1.702 m)   Wt 72.6 kg   SpO2 100%   BMI 25.06 kg/m   Physical Exam Vitals signs and nursing note reviewed.  Constitutional:      General: He is not in acute distress.    Appearance: He is well-developed. He is not diaphoretic.  HENT:     Head: Normocephalic and atraumatic.     Mouth/Throat:     Mouth: Mucous membranes are dry.  Eyes:      Pupils: Pupils are equal, round, and reactive to light.  Neck:     Musculoskeletal: Neck supple.  Cardiovascular:     Rate and Rhythm: Normal rate and regular rhythm.     Pulses: Normal pulses.     Heart sounds: Normal heart sounds.  Pulmonary:     Effort: Pulmonary effort is normal.     Breath sounds: Normal breath sounds.  Abdominal:     Palpations: Abdomen is soft.     Tenderness: There is no abdominal tenderness.  Musculoskeletal:     Right lower leg: No  edema.     Left lower leg: No edema.  Skin:    General: Skin is warm and dry.     Findings: No erythema or rash.  Neurological:     Mental Status: He is alert and oriented to person, place, and time.  Psychiatric:        Behavior: Behavior normal.      ED Treatments / Results  Labs (all labs ordered are listed, but only abnormal results are displayed) Labs Reviewed  CBC WITH DIFFERENTIAL/PLATELET - Abnormal; Notable for the following components:      Result Value   WBC 32.6 (*)    Neutro Abs 26.0 (*)    Monocytes Absolute 3.0 (*)    Basophils Absolute 0.2 (*)    Abs Immature Granulocytes 0.84 (*)    All other components within normal limits  COMPREHENSIVE METABOLIC PANEL - Abnormal; Notable for the following components:   Sodium 133 (*)    CO2 7 (*)    Glucose, Bld 431 (*)    BUN 28 (*)    Creatinine, Ser 1.98 (*)    Alkaline Phosphatase 141 (*)    Total Bilirubin 1.8 (*)    GFR calc non Af Amer 34 (*)    GFR calc Af Amer 40 (*)    Anion gap 27 (*)    All other components within normal limits  LACTIC ACID, PLASMA - Abnormal; Notable for the following components:   Lactic Acid, Venous 4.1 (*)    All other components within normal limits  CBG MONITORING, ED - Abnormal; Notable for the following components:   Glucose-Capillary 426 (*)    All other components within normal limits  POCT I-STAT EG7 - Abnormal; Notable for the following components:   pH, Ven 6.972 (*)    pCO2, Ven 39.5 (*)    pO2, Ven 117.0  (*)    Bicarbonate 9.1 (*)    TCO2 10 (*)    Acid-base deficit 22.0 (*)    Sodium 133 (*)    HCT 54.0 (*)    Hemoglobin 18.4 (*)    All other components within normal limits  SARS CORONAVIRUS 2  LIPASE, BLOOD  URINALYSIS, ROUTINE W REFLEX MICROSCOPIC  LACTIC ACID, PLASMA  BLOOD GAS, VENOUS    EKG None  Radiology No results found.  Procedures .Critical Care Performed by: Tacy Learn, PA-C Authorized by: Tacy Learn, PA-C   Critical care provider statement:    Critical care time (minutes):  45   Critical care was time spent personally by me on the following activities:  Discussions with consultants, evaluation of patient's response to treatment, examination of patient, ordering and performing treatments and interventions, ordering and review of laboratory studies, ordering and review of radiographic studies, pulse oximetry, re-evaluation of patient's condition, obtaining history from patient or surrogate and review of old charts   (including critical care time)  Medications Ordered in ED Medications  insulin regular, human (MYXREDLIN) 100 units/ 100 mL infusion (3.7 Units/hr Intravenous New Bag/Given 03/06/19 1007)  0.9 %  sodium chloride infusion (has no administration in time range)  dextrose 5 %-0.45 % sodium chloride infusion (has no administration in time range)  dextrose 5 %-0.45 % sodium chloride infusion (has no administration in time range)  sodium chloride 0.9 % bolus 1,000 mL (1,000 mLs Intravenous New Bag/Given 03/06/19 1003)  ondansetron (ZOFRAN) injection 4 mg (4 mg Intravenous Given 03/06/19 0934)  sodium chloride 0.9 % bolus 1,000 mL (1,000 mLs Intravenous New Bag/Given  03/06/19 1008)     Initial Impression / Assessment and Plan / ED Course  I have reviewed the triage vital signs and the nursing notes.  Pertinent labs & imaging results that were available during my care of the patient were reviewed by me and considered in my medical decision making  (see chart for details).  Clinical Course as of Mar 05 1029  Sun Mar 06, 2019  1000 63yo male with history of insulin dependent diabetes, normally well managed with insulin pod. Patient ran out of insulin yesterday at 10AM while out of town, awoke today with n/v/d, drove back home and restarted insulin pod then came to the ER with concerns for DKA. On exam, patient is well appearing, mucous membranes dry.  Review of labs, CBG 426, ph 6.972 on VBG. WBC 32.6 with elevated neutrophils at 26.0. CMP with Na 133, K 5.1, Cr 1.98, Co2 7 and anion gap 27. DKA protocol initiated, requested rn to remove insulin pod from patient's right flank. Lactic acid 4.1, given IV fluid bolus. Given Zofran for nausea. Case discussed with Dr. Gilford Raid, ER attending who has seen the patient. Consult to hospitalist service for admission.   [LM]  0352 Case discussed with Dr. Lorin Mercy with [facility] who will consult for admission.   [LM]    Clinical Course User Index [LM] Tacy Learn, PA-C      Final Clinical Impressions(s) / ED Diagnoses   Final diagnoses:  Diabetic ketoacidosis without coma associated with type 1 diabetes mellitus ([place])  AKI (acute kidney injury) [facility])    ED Discharge Orders    None       Roque Lias 03/06/19 1030    Isla Pence, MD 03/06/19 1131

## 2019-03-06 NOTE — ED Triage Notes (Signed)
Patient presents to the ED with suspected DKA. Reports he has not taken insulin since 10 am yesterday. Complains of nausea, vomiting. CBG of 426. Patient A&O x4. No acute distress at this time.

## 2019-03-07 DIAGNOSIS — E1010 Type 1 diabetes mellitus with ketoacidosis without coma: Secondary | ICD-10-CM | POA: Diagnosis not present

## 2019-03-07 LAB — BASIC METABOLIC PANEL
Anion gap: 8 (ref 5–15)
Anion gap: 7 (ref 5–15)
BUN: 17 mg/dL (ref 8–23)
BUN: 15 mg/dL (ref 8–23)
CO2: 17 mmol/L — ABNORMAL LOW (ref 22–32)
CO2: 19 mmol/L — ABNORMAL LOW (ref 22–32)
Calcium: 7.4 mg/dL — ABNORMAL LOW (ref 8.9–10.3)
Calcium: 7.5 mg/dL — ABNORMAL LOW (ref 8.9–10.3)
Chloride: 111 mmol/L (ref 98–111)
Chloride: 109 mmol/L (ref 98–111)
Creatinine, Ser: 0.94 mg/dL (ref 0.61–1.24)
Creatinine, Ser: 0.96 mg/dL (ref 0.61–1.24)
GFR calc Af Amer: >60 mL/min (ref >60)
GFR calc Af Amer: >60 mL/min (ref >60)
GFR calc non Af Amer: >60 mL/min (ref >60)
GFR calc non Af Amer: >60 mL/min (ref >60)
Glucose, Bld: 182 mg/dL — ABNORMAL HIGH (ref 70–99)
Glucose, Bld: 207 mg/dL — ABNORMAL HIGH (ref 70–99)
Potassium: 4 mmol/L (ref 3.5–5.1)
Potassium: 3.5 mmol/L (ref 3.5–5.1)
Sodium: 136 mmol/L (ref 135–145)
Sodium: 135 mmol/L (ref 135–145)

## 2019-03-07 LAB — GLUCOSE, CAPILLARY
Glucose-Capillary: 118 mg/dL — ABNORMAL HIGH (ref 70–99)
Glucose-Capillary: 151 mg/dL — ABNORMAL HIGH (ref 70–99)
Glucose-Capillary: 152 mg/dL — ABNORMAL HIGH (ref 70–99)
Glucose-Capillary: 172 mg/dL — ABNORMAL HIGH (ref 70–99)
Glucose-Capillary: 173 mg/dL — ABNORMAL HIGH (ref 70–99)
Glucose-Capillary: 175 mg/dL — ABNORMAL HIGH (ref 70–99)
Glucose-Capillary: 178 mg/dL — ABNORMAL HIGH (ref 70–99)
Glucose-Capillary: 179 mg/dL — ABNORMAL HIGH (ref 70–99)
Glucose-Capillary: 183 mg/dL — ABNORMAL HIGH (ref 70–99)
Glucose-Capillary: 186 mg/dL — ABNORMAL HIGH (ref 70–99)
Glucose-Capillary: 206 mg/dL — ABNORMAL HIGH (ref 70–99)

## 2019-03-07 MED ORDER — INSULIN GLARGINE 100 UNIT/ML ~~LOC~~ SOLN
8.0000 [IU] | Freq: Every day | SUBCUTANEOUS | Status: DC
  Administered 2019-03-07: 8 [IU] via SUBCUTANEOUS
  Filled 2019-03-07: qty 0.08

## 2019-03-07 MED ORDER — INSULIN PUMP
Freq: Three times a day (TID) | SUBCUTANEOUS | Status: DC
  Administered 2019-03-07: 12:00:00 via SUBCUTANEOUS
  Filled 2019-03-07: qty 1

## 2019-03-07 MED ORDER — INSULIN ASPART 100 UNIT/ML ~~LOC~~ SOLN
4.0000 [IU] | Freq: Three times a day (TID) | SUBCUTANEOUS | Status: DC
  Administered 2019-03-07: 6 [IU] via SUBCUTANEOUS

## 2019-03-07 NOTE — Progress Notes (Signed)
Inpatient Diabetes Program Recommendations  AACE/ADA: New Consensus Statement on Inpatient Glycemic Control (2015)  Target Ranges:  Prepandial:   less than 140 mg/dL      Peak postprandial:   less than 180 mg/dL (1-2 hours)      Critically ill patients:  140 - 180 mg/dL   Lab Results  Component Value Date   GLUCAP 173 (H) 03/07/2019   HGBA1C 10.2 (H) 03/06/2019    Review of Glycemic Control Results for Jordan Waller, Jordan Waller (MRN [id number]) as of 03/07/2019 10:39  Ref. Range 03/07/2019 07:30 03/07/2019 08:44 03/07/2019 10:14  Glucose-Capillary Latest Ref Range: 70 - 99 mg/dL 178 (H) 172 (H) 173 (H)   Diabetes history: DM 2 Outpatient Diabetes medications:  Omnipod 0.9 units/hr-1 unit/10 grams CHO, sensitivity 40 mg/dL- See's Dr. Buddy Duty Current orders for Inpatient glycemic control:  Lantus 8 units q HS, Novolog 4-13 units tid with meals  Inpatient Diabetes Program Recommendations:    Spoke to patient by phone.  He admits to running out of insulin while visiting daughter.  Admitted in DKA- Patient see's Dr. Buddy Duty and states that his last A1C was in the 7% range. Told him his current A1C of 10.2% and that his blood sugar average is in the 250's.  He was not surprised.  He plans to return to [place] in the next month.  Encouraged him to call Dr. Cindra Eves office to get f/u appointment in the next week.  Patient states that he needs batteries for insulin pump.  Diabetes coordinator will take batteries to patient.   MD has ordered restart of insulin pump at 12 noon.  Batteries will be taken to patient and patient notified.    Thanks  Adah Perl, RN, BC-ADM Inpatient Diabetes Coordinator Pager [phone] (8a-5p)

## 2019-03-07 NOTE — Plan of Care (Signed)
  Problem: Cardiac: Goal: Ability to maintain an adequate cardiac output will improve Outcome: Progressing   Problem: Metabolic: Goal: Ability to maintain appropriate glucose levels will improve Outcome: Progressing   Problem: Respiratory: Goal: Will regain and/or maintain adequate ventilation Outcome: Progressing   

## 2019-03-07 NOTE — Progress Notes (Signed)
MD on call notified of pt's anion gap being 8 & of his insulin gtt being less than 1u/hr since his arrival to unit. Will continue to monitor pt & await any new orders. Hoover Brunette, RN

## 2019-03-07 NOTE — Discharge Summary (Signed)
Physician Discharge Summary  Jordan Waller DXI:[id number] DOB: [birth year] DOA: 03/06/2019  PCP: Jordan Cruel, MD  Admit date: 03/06/2019 Discharge date: 03/07/2019  Admitted From: Home Disposition: Home  Recommendations for Outpatient Follow-up:  1. Follow up with PCP in 1-2 weeks 2. Please obtain BMP/CBC in one week 3. Please follow up on the following pending results:  Home Health: None Equipment/Devices: None   Discharge Condition: Stable CODE STATUS: FULL Diet recommendation: Heart Healthy, diabetic  Brief/Interim Summary:  Jordan Waller is a 63 y.o. male with medical history significant of DM; CAD s/p stent 2013; and HLD presenting with concern for DKA. He went to visit his daughter in [place] for the weekend.  He did not pack enough insulin and ran out at 10AM Saturday AM.  Symptoms started about 0230.  He was having n/v, tingling, very thirsty, polydipsia.  His wife could smell the acetone when he walked in.  He drove himself home 3 1/2 hours.  No fever or concern for COVID exposure.   ED Course:  Has insulin pod, ran out of insulin at 10AM.  At 0200, awoke with diarrhea, vomiting.  Drove home and glucose too high to read.  Back to 426 here in ER.  PH 6.972, WBC 32.6.  CO2 7, gap 27.  AKI, Creatinine 1.98.  Lactate 4.1.  Patient is admitted overnight.  His anion gap was 2 and he was continued on gentle fluids and insulin drip.  Patient also received Lantus and he was subsequently transitioned to his insulin pump and blood sugar remained stable at 150 at the time of discharge.  He has been able to take p.o. well, feels well and reports that he is going to check and make sure that he has enough supply of insulin whenever he goes out of home. At this time is medically stable for discharge.  He is wanting to go home this afternoon. Chronically stable, refer to HPI continue home meds.  His AKI has resolved. Discharge Diagnoses:  Principal Problem:   DKA (diabetic ketoacidosis)  ([place]) Active Problems:   Coronary artery disease involving native coronary artery of native heart without angina pectoris   Dyslipidemia   Chronic folliculitis   AKI (acute kidney injury) [facility])    Discharge Instructions  Discharge Instructions    Diet Carb Modified   Complete by: As directed    Discharge instructions   Complete by: As directed    Please call call MD or return to ER for similar or worsening recurring problem that brought you to hospital or if any fever,nausea/vomiting,abdominal pain, uncontrolled pain, chest pain,  shortness of breath or any other alarming symptoms.  Check blood sugar 3 times a day and bedtime at home. If blood sugar running above 200 less than 70 please call your MD to adjust insulin. If blood sugars running less 100 do not use insulin and call MD. If you noticed signs and symptoms of hypoglycemia or low blood sugar like jitteriness, confusion, thirst, tremor, sweating- Check blood sugar, drink sugary drink/biscuits/sweets to increase sugar level and call MD or return to ER.  Please follow-up your doctor as instructed in a week time and call the office for appointment.  Please avoid alcohol, smoking, or any other illicit substance and maintain healthy habits including taking your regular medications as prescribed.  You were cared for by a hospitalist during your hospital stay. If you have any questions about your discharge medications or the care you received while you were in the  hospital after you are discharged, you can call the unit and ask to speak with the hospitalist on call if the hospitalist that took care of you is not available.  Once you are discharged, your primary care physician will handle any further medical issues. Please note that NO REFILLS for any discharge medications will be authorized once you are discharged, as it is imperative that you return to your primary care physician (or establish a relationship with a primary care  physician if you do not have one) for your aftercare needs so that they can reassess your need for medications and monitor your lab values.   Increase activity slowly   Complete by: As directed      Allergies as of 03/07/2019   No Known Allergies     Medication List    TAKE these medications   aspirin EC 81 MG tablet Take 81 mg by mouth daily.   b complex vitamins capsule Take 1 capsule by mouth daily.   BD Pen Needle Nano U/F 32G X 4 MM Misc Generic drug: Insulin Pen Needle as directed.   Insulin Pen Needle 32G X 4 MM Misc by Does not apply route.   clopidogrel 75 MG tablet Commonly known as: PLAVIX TAKE 1 TABLET BY MOUTH EVERY DAY   doxycycline 100 MG tablet Commonly known as: VIBRA-TABS Take 100 mg by mouth 2 (two) times daily.   insulin aspart 100 UNIT/ML injection Commonly known as: novoLOG Inject 4-13 Units into the skin 3 (three) times daily with meals. Sliding scale: If blood glucose is 70 to 130: take 4 units at beginning of meal.  CBG 131 to 160: 5 units.   161 to 190: 6 units.   191 to 220: 7 units.   221 to 250: 8 units.   251 to 280:  9 units.   281 to 310: then 10 units.   311 to 340: 11 units.   341 to 370: 12 units.   371 to 400: 13 units.   Over 400 mg/dL: 13 units and notify your MD   NovoLOG 100 UNIT/ML injection Generic drug: insulin aspart 1 Units continuous. INSULIN PUMP I unit every hour on the hour Test five times a day B/s over 100 get an acute unit of one unit per hundred   insulin glargine 100 UNIT/ML injection Commonly known as: LANTUS Inject 15 Units into the skin as needed. Backup for Insulin Pump   lisinopril 2.5 MG tablet Commonly known as: ZESTRIL Take 2.5 mg by mouth daily.   metoprolol tartrate 25 MG tablet Commonly known as: LOPRESSOR TAKE 1 TABLET BY MOUTH TWICE A DAY   nitroGLYCERIN 0.4 MG SL tablet Commonly known as: NITROSTAT Place 0.4 mg under the tongue every 5 (five) minutes as needed for chest pain. Up to 3 times  then call 911   rosuvastatin 10 MG tablet Commonly known as: CRESTOR TAKE 1 TABLET (10 MG TOTAL) BY MOUTH DAILY. PLEASE KEEP UPCOMING APPT IN MAY FOR FUTURE REFILLS. [place] What changed: additional instructions       No Known Allergies  Consultations:  Diabetes coordinator.  Procedures/Studies:  No results found.   Subjective: No acute events overnight.  Denies nausea vomiting chest pain.  Blood sugar stable.  Off insulin drip, eating well. Wants to go home today.  Discharge Exam: Vitals:   03/07/19 0538 03/07/19 1120  BP:  94/62  Pulse: (!) 53 79  Resp:  15  Temp: 98.6 F (37 C) 98.2 F (36.8 C)  SpO2: 98%    Vitals:   03/06/19 2352 03/07/19 0350 03/07/19 0538 03/07/19 1120  BP: (!) 97/56 (!) 101/54  94/62  Pulse: 66  (!) 53 79  Resp: 12 12  15   Temp: 98.3 F (36.8 C)  98.6 F (37 C) 98.2 F (36.8 C)  TempSrc: Oral  Oral Oral  SpO2: 99%  98%   Weight:   75.2 kg   Height:        General: Pt is alert, awake, not in acute distress Cardiovascular: RRR, S1/S2 +, no rubs, no gallops Respiratory: CTA bilaterally, no wheezing, no rhonchi Abdominal: Soft, NT, ND, bowel sounds + Extremities: no edema, no cyanosis   The results of significant diagnostics from this hospitalization (including imaging, microbiology, ancillary and laboratory) are listed below for reference.     Microbiology: Recent Results (from the past 240 hour(s))  SARS Coronavirus 2 [facility] order, Performed in [facility] hospital lab) Nasopharyngeal Nasopharyngeal Swab     Status: None   Collection Time: 03/06/19 11:49 AM   Specimen: Nasopharyngeal Swab  Result Value Ref Range Status   SARS Coronavirus 2 NEGATIVE NEGATIVE Final    Comment: (NOTE) If result is NEGATIVE SARS-CoV-2 target nucleic acids are NOT DETECTED. The SARS-CoV-2 RNA is generally detectable in upper and lower  respiratory specimens during the acute phase of infection. The lowest  concentration of SARS-CoV-2 viral  copies this assay can detect is 250  copies / mL. A negative result does not preclude SARS-CoV-2 infection  and should not be used as the sole basis for treatment or other  patient management decisions.  A negative result may occur with  improper specimen collection / handling, submission of specimen other  than nasopharyngeal swab, presence of viral mutation(s) within the  areas targeted by this assay, and inadequate number of viral copies  (<250 copies / mL). A negative result must be combined with clinical  observations, patient history, and epidemiological information. If result is POSITIVE SARS-CoV-2 target nucleic acids are DETECTED. The SARS-CoV-2 RNA is generally detectable in upper and lower  respiratory specimens dur ing the acute phase of infection.  Positive  results are indicative of active infection with SARS-CoV-2.  Clinical  correlation with patient history and other diagnostic information is  necessary to determine patient infection status.  Positive results do  not rule out bacterial infection or co-infection with other viruses. If result is PRESUMPTIVE POSTIVE SARS-CoV-2 nucleic acids MAY BE PRESENT.   A presumptive positive result was obtained on the submitted specimen  and confirmed on repeat testing.  While 2019 novel coronavirus  (SARS-CoV-2) nucleic acids may be present in the submitted sample  additional confirmatory testing may be necessary for epidemiological  and / or clinical management purposes  to differentiate between  SARS-CoV-2 and other Sarbecovirus currently known to infect humans.  If clinically indicated additional testing with an alternate test  methodology [phone]) is advised. The SARS-CoV-2 RNA is generally  detectable in upper and lower respiratory sp ecimens during the acute  phase of infection. The expected result is Negative. Fact Sheet for Patients:  StrictlyIdeas.no Fact Sheet for Healthcare  Providers: BankingDealers.co.za This test is not yet approved or cleared by the [place] FDA and has been authorized for detection and/or diagnosis of SARS-CoV-2 by FDA under an Emergency Use Authorization (EUA).  This EUA will remain in effect (meaning this test can be used) for the duration of the COVID-19 declaration under Section 564(b)(1) of the Act, 21 U.S.C. section 360bbb-3(b)(1),  unless the authorization is terminated or revoked sooner. Performed at [facility], [place] [address], [place], [place] [address]      Labs: BNP (last 3 results) No results for input(s): BNP in the last 8760 hours. Basic Metabolic Panel: Recent Labs  Lab 03/06/19 0905 03/06/19 0926 03/06/19 1301 03/06/19 2125 03/07/19 0158 03/07/19 0607  NA 133* 133* 135 136 136 135  K 5.1 5.0 4.7 3.9 4.0 3.5  CL 99  --  106 110 111 109  CO2 7*  --  13* 20* 17* 19*  GLUCOSE 431*  --  274* 109* 182* 207*  BUN 28*  --  24* 18 17 15   CREATININE 1.98*  --  1.38* 1.05 0.94 0.96  CALCIUM 9.2  --  8.1* 7.8* 7.4* 7.5*   Liver Function Tests: Recent Labs  Lab 03/06/19 0905  AST 36  ALT 36  ALKPHOS 141*  BILITOT 1.8*  PROT 7.5  ALBUMIN 4.7   Recent Labs  Lab 03/06/19 0905  LIPASE 18   No results for input(s): AMMONIA in the last 168 hours. CBC: Recent Labs  Lab 03/06/19 0905 03/06/19 0926  WBC 32.6*  --   NEUTROABS 26.0*  --   HGB 16.6 18.4*  HCT 51.3 54.0*  MCV 99.0  --   PLT 305  --    Cardiac Enzymes: No results for input(s): CKTOTAL, CKMB, CKMBINDEX, TROPONINI in the last 168 hours. BNP: Invalid input(s): POCBNP CBG: Recent Labs  Lab 03/07/19 0730 03/07/19 0844 03/07/19 1014 03/07/19 1120 03/07/19 1458  GLUCAP 178* 172* 173* 175* 151*   D-Dimer No results for input(s): DDIMER in the last 72 hours. Hgb A1c Recent Labs    03/06/19 1301  HGBA1C 10.2*   Lipid Profile No results for input(s): CHOL, HDL, LDLCALC, TRIG, CHOLHDL, LDLDIRECT in the  last 72 hours. Thyroid function studies No results for input(s): TSH, T4TOTAL, T3FREE, THYROIDAB in the last 72 hours.  Invalid input(s): FREET3 Anemia work up No results for input(s): VITAMINB12, FOLATE, FERRITIN, TIBC, IRON, RETICCTPCT in the last 72 hours. Urinalysis    Component Value Date/Time   COLORURINE STRAW (A) 03/06/2019 1421   APPEARANCEUR CLEAR 03/06/2019 1421   LABSPEC 1.020 03/06/2019 1421   PHURINE 5.0 03/06/2019 1421   GLUCOSEU >=500 (A) 03/06/2019 1421   HGBUR NEGATIVE 03/06/2019 1421   BILIRUBINUR NEGATIVE 03/06/2019 1421   KETONESUR 80 (A) 03/06/2019 1421   PROTEINUR NEGATIVE 03/06/2019 1421   UROBILINOGEN 0.2 03/02/2012 1430   NITRITE NEGATIVE 03/06/2019 1421   LEUKOCYTESUR NEGATIVE 03/06/2019 1421   Sepsis Labs Invalid input(s): PROCALCITONIN,  WBC,  LACTICIDVEN Microbiology Recent Results (from the past 240 hour(s))  SARS Coronavirus 2 [facility] order, Performed in [facility] hospital lab) Nasopharyngeal Nasopharyngeal Swab     Status: None   Collection Time: 03/06/19 11:49 AM   Specimen: Nasopharyngeal Swab  Result Value Ref Range Status   SARS Coronavirus 2 NEGATIVE NEGATIVE Final    Comment: (NOTE) If result is NEGATIVE SARS-CoV-2 target nucleic acids are NOT DETECTED. The SARS-CoV-2 RNA is generally detectable in upper and lower  respiratory specimens during the acute phase of infection. The lowest  concentration of SARS-CoV-2 viral copies this assay can detect is 250  copies / mL. A negative result does not preclude SARS-CoV-2 infection  and should not be used as the sole basis for treatment or other  patient management decisions.  A negative result may occur with  improper specimen collection / handling, submission of specimen other  than nasopharyngeal swab, presence of viral mutation(s) within the  areas targeted by this assay, and inadequate number of viral copies  (<250 copies / mL). A negative result must be combined with clinical   observations, patient history, and epidemiological information. If result is POSITIVE SARS-CoV-2 target nucleic acids are DETECTED. The SARS-CoV-2 RNA is generally detectable in upper and lower  respiratory specimens dur ing the acute phase of infection.  Positive  results are indicative of active infection with SARS-CoV-2.  Clinical  correlation with patient history and other diagnostic information is  necessary to determine patient infection status.  Positive results do  not rule out bacterial infection or co-infection with other viruses. If result is PRESUMPTIVE POSTIVE SARS-CoV-2 nucleic acids MAY BE PRESENT.   A presumptive positive result was obtained on the submitted specimen  and confirmed on repeat testing.  While 2019 novel coronavirus  (SARS-CoV-2) nucleic acids may be present in the submitted sample  additional confirmatory testing may be necessary for epidemiological  and / or clinical management purposes  to differentiate between  SARS-CoV-2 and other Sarbecovirus currently known to infect humans.  If clinically indicated additional testing with an alternate test  methodology [phone]) is advised. The SARS-CoV-2 RNA is generally  detectable in upper and lower respiratory sp ecimens during the acute  phase of infection. The expected result is Negative. Fact Sheet for Patients:  StrictlyIdeas.no Fact Sheet for Healthcare Providers: BankingDealers.co.za This test is not yet approved or cleared by the [place] FDA and has been authorized for detection and/or diagnosis of SARS-CoV-2 by FDA under an Emergency Use Authorization (EUA).  This EUA will remain in effect (meaning this test can be used) for the duration of the COVID-19 declaration under Section 564(b)(1) of the Act, 21 U.S.C. section 360bbb-3(b)(1), unless the authorization is terminated or revoked sooner. Performed at [facility], [place] [address]  [place], [place] [address]      Time coordinating discharge: 25 minutes  SIGNED:   Antonieta Pert, MD  [facility] 03/07/2019, 3:02 PM  If 7PM-7AM, please contact night-coverage [email or web address]

## 2019-04-28 ENCOUNTER — Other Ambulatory Visit: Payer: Self-pay | Admitting: Cardiovascular Disease

## 2019-10-18 ENCOUNTER — Other Ambulatory Visit: Payer: Self-pay | Admitting: Cardiovascular Disease

## 2019-10-21 ENCOUNTER — Other Ambulatory Visit: Payer: Self-pay | Admitting: Cardiovascular Disease

## 2020-01-05 ENCOUNTER — Ambulatory Visit: Payer: Managed Care, Other (non HMO) | Admitting: Physician Assistant

## 2020-01-05 ENCOUNTER — Other Ambulatory Visit: Payer: Self-pay

## 2020-01-05 ENCOUNTER — Encounter: Payer: Self-pay | Admitting: Physician Assistant

## 2020-01-05 DIAGNOSIS — Z1283 Encounter for screening for malignant neoplasm of skin: Secondary | ICD-10-CM

## 2020-01-05 DIAGNOSIS — L570 Actinic keratosis: Secondary | ICD-10-CM

## 2020-01-05 NOTE — Progress Notes (Signed)
   Follow-Up Visit   Subjective  Jordan Waller is a 64 y.o. male who presents for the following: Annual Exam (scaly spots on scalp).   The following portions of the chart were reviewed this encounter and updated as appropriate: Tobacco  Allergies  Meds  Problems  Med Hx  Surg Hx  Fam Hx      Objective  Well appearing patient in no apparent distress; mood and affect are within normal limits.  All skin waist up examined.  Objective  head to toe: No DN, No signs NMSC  Objective  Mid Frontal Scalp (10): Erythematous patches with gritty scale.  Assessment & Plan  Screening exam for skin cancer head to toe  AK (actinic keratosis) (10) Mid Frontal Scalp  Destruction of lesion - Mid Frontal Scalp Complexity: simple   Destruction method: cryotherapy   Informed consent: discussed and consent obtained   Timeout:  patient name, date of birth, surgical site, and procedure verified Lesion destroyed using liquid nitrogen: Yes   Cryotherapy cycles:  1 Outcome: patient tolerated procedure well with no complications   Post-procedure details: wound care instructions given

## 2020-01-11 ENCOUNTER — Encounter: Payer: Self-pay | Admitting: Cardiovascular Disease

## 2020-01-11 ENCOUNTER — Ambulatory Visit: Payer: Managed Care, Other (non HMO) | Admitting: Cardiovascular Disease

## 2020-01-11 ENCOUNTER — Other Ambulatory Visit: Payer: Self-pay

## 2020-01-11 VITALS — BP 130/66 | HR 76 | Ht 67.0 in | Wt 170.4 lb

## 2020-01-11 DIAGNOSIS — E7800 Pure hypercholesterolemia, unspecified: Secondary | ICD-10-CM

## 2020-01-11 DIAGNOSIS — I25810 Atherosclerosis of coronary artery bypass graft(s) without angina pectoris: Secondary | ICD-10-CM

## 2020-01-11 NOTE — Progress Notes (Signed)
Chief Complaint  Patient presents with   Follow-up    CAD    History of Present Illness: 64 yo male with history of CAD, DM, HLD and former tobacco abuse who is here today for cardiac follow up. He presented to [facility] August 2013 with weakness, nausea, vomiting, generalized pain, and elevated blood sugars starting 02/29/12. He was found to be in DKA. Cardiac cath on 03/04/12 showed a 99% mid LAD stenosis. This was treated with a 3.0 x 16 mm Promus Element DES. Normal exercise stress test in December 2017.    He is here today for follow up. The patient denies any chest pain, dyspnea, palpitations, lower extremity edema, orthopnea, PND, dizziness, near syncope or syncope.   Primary Care Physician: Lawerance Cruel, MD   Past Medical History:  Diagnosis Date   CAD (coronary artery disease)    NSTEMI August 2013, DES mid LAD   Chronic folliculitis    Diabetes mellitus 2012   on insulin pump throughout   High cholesterol    Hypertension     Past Surgical History:  Procedure Laterality Date   LEFT HEART CATHETERIZATION WITH CORONARY ANGIOGRAM N/A 03/04/2012   Procedure: LEFT HEART CATHETERIZATION WITH CORONARY ANGIOGRAM;  Surgeon: Burnell Blanks, MD;  Location: [facility] CATH LAB;  Service: Cardiovascular;  Laterality: N/A;   ROTATOR CUFF REPAIR     TONSILLECTOMY AND ADENOIDECTOMY      Current Outpatient Medications  Medication Sig Dispense Refill   aspirin EC 81 MG tablet Take 81 mg by mouth daily.     b complex vitamins capsule Take 1 capsule by mouth daily.     BD PEN NEEDLE NANO U/F 32G X 4 MM MISC as directed.     clopidogrel (PLAVIX) 75 MG tablet TAKE 1 TABLET BY MOUTH EVERY DAY (Patient taking differently: Take 75 mg by mouth daily. ) 90 tablet 3   doxycycline (VIBRA-TABS) 100 MG tablet Take 100 mg by mouth 2 (two) times daily.      insulin aspart (NOVOLOG) 100 UNIT/ML injection Inject 4-13 Units into the skin 3 (three) times daily with meals.  Sliding scale: If blood glucose is 70 to 130: take 4 units at beginning of meal.  CBG 131 to 160: 5 units.   161 to 190: 6 units.   191 to 220: 7 units.   221 to 250: 8 units.   251 to 280:  9 units.   281 to 310: then 10 units.   311 to 340: 11 units.   341 to 370: 12 units.   371 to 400: 13 units.   Over 400 mg/dL: 13 units and notify your MD 5 pen 3   insulin glargine (LANTUS) 100 UNIT/ML injection Inject 15 Units into the skin as needed. Backup for Insulin Pump     Insulin Pen Needle 32G X 4 MM MISC by Does not apply route.     lisinopril (PRINIVIL,ZESTRIL) 2.5 MG tablet Take 2.5 mg by mouth daily.  3   metoprolol tartrate (LOPRESSOR) 25 MG tablet Take 1 tablet (25 mg total) by mouth 2 (two) times daily. Please make yearly appt with Dr. Angelena Form for June for future refills. 1st attempt 180 tablet 0   nitroGLYCERIN (NITROSTAT) 0.4 MG SL tablet Place 0.4 mg under the tongue every 5 (five) minutes as needed for chest pain. Up to 3 times then call 911     NOVOLOG 100 UNIT/ML injection 1 Units continuous. INSULIN PUMP I unit every hour  on the hour Test five times a day B/s over 100 get an acute unit of one unit per hundred  5   rosuvastatin (CRESTOR) 10 MG tablet TAKE 1 TABLET BY MOUTH DAILY. PLEASE KEEP UPCOMING APPT IN MAY FOR FUTURE REFILLS. THANK YOU 180 tablet 1   No current facility-administered medications for this visit.    No Known Allergies  Social History   Socioeconomic History   Marital status: Married    Spouse name: Not on file   Number of children: 3   Years of education: Not on file   Highest education level: Not on file  Occupational History   Occupation: corp vice president    Employer: [employer]  Tobacco Use   Smoking status: Former Smoker    Packs/day: 0.50    Years: 20.00    Pack years: 10.00    Quit date: 2012    Years since quitting: 9.4   Smokeless tobacco: Never Used  Substance and Sexual Activity   Alcohol use: Yes    Alcohol/week:  1.0 standard drink    Types: 1 Standard drinks or equivalent per week    Comment: 2-4 drinks 3 times a week   Drug use: No   Sexual activity: Not on file  Other Topics Concern   Not on file  Social History Narrative   Patient works for a company based on [place] and travels there for 4 months at a time, returning only for about 6 weeks.  He returned home in June (2020) and has been unable to return thus far due to the COVID-19 pandemic.   Social Determinants of Health   Financial Resource Strain:    Difficulty of Paying Living Expenses:   Food Insecurity:    Worried About Charity fundraiser in the Last Year:    Arboriculturist in the Last Year:   Transportation Needs:    Film/video editor (Medical):    Lack of Transportation (Non-Medical):   Physical Activity:    Days of Exercise per Week:    Minutes of Exercise per Session:   Stress:    Feeling of Stress :   Social Connections:    Frequency of Communication with Friends and Family:    Frequency of Social Gatherings with Friends and Family:    Attends Religious Services:    Active Member of Clubs or Organizations:    Attends Music therapist:    Marital Status:   Intimate Partner Violence:    Fear of Current or Ex-Partner:    Emotionally Abused:    Physically Abused:    Sexually Abused:     Family History  Problem Relation Age of Onset   Colon cancer Mother    Diabetes type II Mother    Colon cancer Sister    Coronary artery disease Neg Hx        No first degree relatives with CAD    Review of Systems:  As stated in the HPI and otherwise negative.   BP 130/66    Pulse 76    Ht 5\' 7"  (1.702 m)    Wt 170 lb 6.4 oz (77.3 kg)    SpO2 97%    BMI 26.69 kg/m   Physical Examination:  General: Well developed, well nourished, NAD  HEENT: OP clear, mucus membranes moist  SKIN: warm, dry. No rashes. Neuro: No focal deficits  Musculoskeletal: Muscle strength 5/5 all ext    Psychiatric: Mood and affect normal  Neck: No  JVD, no carotid bruits, no thyromegaly, no lymphadenopathy.  Lungs:Clear bilaterally, no wheezes, rhonci, crackles Cardiovascular: Regular rate and rhythm. No murmurs, gallops or rubs. Abdomen:Soft. Bowel sounds present. Non-tender.  Extremities: No lower extremity edema. Pulses are 2 + in the bilateral DP/PT.   Cardiac cath 03/04/12:  Left main: No disease.  Left Anterior Descending Artery: Large caliber vessel that courses to the apex. The ostium has 25% stenosis. The proximal vessel has luminal irregularities. The mid vessel has a 99% stenosis. The distal vessel has no obstructive disease. There is a moderate sized diagonal branch with no disease noted.  Circumflex Artery: Moderate sized vessel with moderate sized first obtuse marginal branch. The AV groove Circumflex is moderate sized beyond the OM branch. No disease noted.  Right Coronary Artery: Large, dominant vessel with mild luminal irregularities in the proximal/mid and distal vessel. The PDA has 20% ostial stenosis.  Left Ventricular Angiogram: LVEF 40-45% with hypokinesis of the anterior wall and apex.  Impression:  1. Single vessel CAD, now s/p successful PTCA/DES x 1 mid LAD  2. Segmental LV systolic dysfunction  3. NSTEMI   Echo 09/06/12: Left ventricle: The cavity size was normal. Wall thickness was normal. Systolic function was normal. The estimated ejection fraction was in the range of 60% to 65%. Doppler parameters are consistent with abnormal left ventricular relaxation (grade 1 diastolic dysfunction). - Mitral valve: Calcified annulus. Mildly thickened leaflets  EKG:  EKG is ordered today. The ekg ordered today demonstrates   Recent Labs: 03/06/2019: ALT 36; Hemoglobin 18.4; Platelets 305 03/07/2019: BUN 15; Creatinine, Ser 0.96; Potassium 3.5; Sodium 135   Lipid Panel Followed in primary care.    Wt Readings from Last 3 Encounters:  01/11/20 170 lb 6.4 oz (77.3  kg)  03/07/19 165 lb 11.2 oz (75.2 kg)  10/16/17 165 lb (74.8 kg)     Other studies Reviewed: Additional studies/ records that were reviewed today include: . Review of the above records demonstrates:    Assessment and Plan:   1. CAD without angina: He had a drug eluting stent placed in his mid LAD in August 2013 during presentation for NSTEMI in setting of DKA.  Exercise stress test December 2017 without ischemia. No chest pain. He exercises regularly. Will not repeat a stress test at this time. Continue ASA, Plavix, statin and beta blocker.    2. Hyperlipidemia: Lipids followed in primary care. He will bring in those records. Will continue statin  Current medicines are reviewed at length with the patient today.  The patient does not have concerns regarding medicines.  The following changes have been made:  no change  Labs/ tests ordered today include:   Orders Placed This Encounter  Procedures   EKG 12-Lead   ECHOCARDIOGRAM COMPLETE    Disposition:   FU with me in 12  months  Signed, Lauree Chandler, MD 01/11/2020 3:36 PM    [facility] [address], [place],   [address] Phone: [phone]; Fax: [phone]

## 2020-01-11 NOTE — Patient Instructions (Signed)
Medication Instructions:  No change *If you need a refill on your cardiac medications before your next appointment, please call your pharmacy*   Lab Work: none If you have labs (blood work) drawn today and your tests are completely normal, you will receive your results only by: Marland Kitchen MyChart Message (if you have MyChart) OR . A paper copy in the mail If you have any lab test that is abnormal or we need to change your treatment, we will call you to review the results.   Testing/Procedures: Your physician has requested that you have an echocardiogram. Echocardiography is a painless test that uses sound waves to create images of your heart. It provides your doctor with information about the size and shape of your heart and how well your heart's chambers and valves are working. This procedure takes approximately one hour. There are no restrictions for this procedure.   Follow-Up: At [facility], you and your health needs are our priority.  As part of our continuing mission to provide you with exceptional heart care, we have created designated Provider Care Teams.  These Care Teams include your primary Cardiologist (physician) and Advanced Practice Providers (APPs -  Physician Assistants and Nurse Practitioners) who all work together to provide you with the care you need, when you need it.  We recommend signing up for the patient portal called "MyChart".  Sign up information is provided on this After Visit Summary.  MyChart is used to connect with patients for Virtual Visits (Telemedicine).  Patients are able to view lab/test results, encounter notes, upcoming appointments, etc.  Non-urgent messages can be sent to your provider as well.   To learn more about what you can do with MyChart, go to [email or web address].    Your next appointment:   12 month(s)  The format for your next appointment:   In Person  Provider:   You may see Lauree Chandler, MD or one of the following Advanced  Practice Providers on your designated Care Team:    Melina Copa, PA-C  Ermalinda Barrios, PA-C   Other Instructions

## 2020-01-26 ENCOUNTER — Other Ambulatory Visit: Payer: Self-pay | Admitting: Cardiovascular Disease

## 2020-01-27 ENCOUNTER — Other Ambulatory Visit: Payer: Self-pay | Admitting: Physician Assistant

## 2020-01-28 ENCOUNTER — Emergency Department (HOSPITAL_COMMUNITY)
Admission: EM | Admit: 2020-01-28 | Discharge: 2020-01-29 | Disposition: A | Discharge status: Home / Self Care | Payer: Managed Care, Other (non HMO) | Attending: Emergency Medicine | Admitting: Emergency Medicine

## 2020-01-28 ENCOUNTER — Encounter (HOSPITAL_COMMUNITY): Payer: Self-pay | Admitting: *Deleted

## 2020-01-28 ENCOUNTER — Other Ambulatory Visit: Payer: Self-pay

## 2020-01-28 ENCOUNTER — Emergency Department (HOSPITAL_COMMUNITY): Payer: Managed Care, Other (non HMO)

## 2020-01-28 DIAGNOSIS — S0001XA Abrasion of scalp, initial encounter: Secondary | ICD-10-CM | POA: Insufficient documentation

## 2020-01-28 DIAGNOSIS — Z7982 Long term (current) use of aspirin: Secondary | ICD-10-CM | POA: Insufficient documentation

## 2020-01-28 DIAGNOSIS — Y9389 Activity, other specified: Secondary | ICD-10-CM | POA: Diagnosis not present

## 2020-01-28 DIAGNOSIS — I2510 Atherosclerotic heart disease of native coronary artery without angina pectoris: Secondary | ICD-10-CM | POA: Insufficient documentation

## 2020-01-28 DIAGNOSIS — Z79899 Other long term (current) drug therapy: Secondary | ICD-10-CM | POA: Diagnosis not present

## 2020-01-28 DIAGNOSIS — Y908 Blood alcohol level of 240 mg/100 ml or more: Secondary | ICD-10-CM | POA: Insufficient documentation

## 2020-01-28 DIAGNOSIS — S0990XA Unspecified injury of head, initial encounter: Secondary | ICD-10-CM | POA: Diagnosis present

## 2020-01-28 DIAGNOSIS — E119 Type 2 diabetes mellitus without complications: Secondary | ICD-10-CM | POA: Diagnosis not present

## 2020-01-28 DIAGNOSIS — Z794 Long term (current) use of insulin: Secondary | ICD-10-CM | POA: Diagnosis not present

## 2020-01-28 DIAGNOSIS — Y929 Unspecified place or not applicable: Secondary | ICD-10-CM | POA: Diagnosis not present

## 2020-01-28 DIAGNOSIS — Z7902 Long term (current) use of antithrombotics/antiplatelets: Secondary | ICD-10-CM | POA: Insufficient documentation

## 2020-01-28 DIAGNOSIS — Z87891 Personal history of nicotine dependence: Secondary | ICD-10-CM | POA: Insufficient documentation

## 2020-01-28 DIAGNOSIS — Y999 Unspecified external cause status: Secondary | ICD-10-CM | POA: Diagnosis not present

## 2020-01-28 DIAGNOSIS — W109XXA Fall (on) (from) unspecified stairs and steps, initial encounter: Secondary | ICD-10-CM | POA: Diagnosis not present

## 2020-01-28 DIAGNOSIS — S161XXA Strain of muscle, fascia and tendon at neck level, initial encounter: Secondary | ICD-10-CM

## 2020-01-28 DIAGNOSIS — F10920 Alcohol use, unspecified with intoxication, uncomplicated: Secondary | ICD-10-CM | POA: Diagnosis not present

## 2020-01-28 LAB — CBC WITH DIFFERENTIAL/PLATELET
Abs Immature Granulocytes: 0.05 10*3/uL (ref 0.00–0.07)
Basophils Absolute: 0.1 10*3/uL (ref 0.0–0.1)
Basophils Relative: 1 %
Eosinophils Absolute: 0.1 10*3/uL (ref 0.0–0.5)
Eosinophils Relative: 1 %
HCT: 43.2 % (ref 39.0–52.0)
Hemoglobin: 14.7 g/dL (ref 13.0–17.0)
Immature Granulocytes: 1 %
Lymphocytes Relative: 38 %
Lymphs Abs: 2.8 10*3/uL (ref 0.7–4.0)
MCH: 32 pg (ref 26.0–34.0)
MCHC: 34 g/dL (ref 30.0–36.0)
MCV: 93.9 fL (ref 80.0–100.0)
Monocytes Absolute: 0.6 10*3/uL (ref 0.1–1.0)
Monocytes Relative: 9 %
Neutro Abs: 3.8 10*3/uL (ref 1.7–7.7)
Neutrophils Relative %: 50 %
Platelets: 213 10*3/uL (ref 150–400)
RBC: 4.6 MIL/uL (ref 4.22–5.81)
RDW: 11.9 % (ref 11.5–15.5)
WBC: 7.4 10*3/uL (ref 4.0–10.5)
nRBC: 0 % (ref 0.0–0.2)

## 2020-01-28 NOTE — ED Triage Notes (Signed)
Pt arrived by [facility] after reportedly falling down two steps onto pavement. Hematoma and laceration noted to L forehead. GCS 14 for repetitive questioning. Level 2 activated on arrival. Pt on Plavix

## 2020-01-28 NOTE — ED Notes (Signed)
RN notified CT that pt is ready for scans. 

## 2020-01-28 NOTE — Progress Notes (Signed)
   01/28/20 2355  Clinical Encounter Type  Visited With Patient;Health care provider  Visit Type Initial;ED;Trauma    Chaplain responded to a trauma in the ED.  Chaplain checked in with the patient, who indicated no needs at this time. Chaplain introduced spiritual care services.  Spiritual care services available as needed.   Jeri Lager, Chaplain

## 2020-01-28 NOTE — ED Notes (Signed)
Patient transported to CT 

## 2020-01-28 NOTE — ED Provider Notes (Signed)
[facility] EMERGENCY DEPARTMENT Provider Note   CSN: [id number] Arrival date & time: 01/28/20  2338     History No chief complaint on file.   Jordan Waller is a 64 y.o. male.  This patient is a 64 year old male with history of coronary artery disease, diabetes, hypertension, hyperlipidemia, on Plavix.  He is brought by EMS for evaluation of fall.  History conveyed to me by EMS is that the patient was consuming alcohol this evening, then fell striking his head.  Patient does not recall what transpired.  He has no complaints at present.  He does appear intoxicated and history somewhat limited secondary to intoxication, potential for head injury.  The history is provided by the patient.       Past Medical History:  Diagnosis Date  . CAD (coronary artery disease)    NSTEMI August 2013, DES mid LAD  . Chronic folliculitis   . Diabetes mellitus 2012   on insulin pump throughout  . High cholesterol   . Hypertension     Patient Active Problem List   Diagnosis Date Noted  . DKA (diabetic ketoacidosis) ([place]) 03/06/2019  . Dyslipidemia 03/06/2019  . AKI (acute kidney injury) ([place]) 03/06/2019  . Hypertension   . High cholesterol   . CAD (coronary artery disease)   . Chronic folliculitis   . Coronary artery disease involving native coronary artery of native heart without angina pectoris 12/14/2018  . Hx of NSTEMI in 2013 tx with DES to LAD 03/02/2012  . DKA (diabetic ketoacidoses) ([place]) 03/01/2012  . Metabolic acidosis 40/02/6760  . Nausea & vomiting 03/01/2012  . Diarrhea 03/01/2012    Past Surgical History:  Procedure Laterality Date  . LEFT HEART CATHETERIZATION WITH CORONARY ANGIOGRAM N/A 03/04/2012   Procedure: LEFT HEART CATHETERIZATION WITH CORONARY ANGIOGRAM;  Surgeon: Burnell Blanks, MD;  Location: [facility] CATH LAB;  Service: Cardiovascular;  Laterality: N/A;  . ROTATOR CUFF REPAIR    . TONSILLECTOMY AND ADENOIDECTOMY         Family  History  Problem Relation Age of Onset  . Colon cancer Mother   . Diabetes type II Mother   . Colon cancer Sister   . Coronary artery disease Neg Hx        No first degree relatives with CAD    Social History   Tobacco Use  . Smoking status: Former Smoker    Packs/day: 0.50    Years: 20.00    Pack years: 10.00    Quit date: 2012    Years since quitting: 9.5  . Smokeless tobacco: Never Used  Substance Use Topics  . Alcohol use: Yes    Alcohol/week: 1.0 standard drink    Types: 1 Standard drinks or equivalent per week    Comment: 2-4 drinks 3 times a week  . Drug use: No    Home Medications Prior to Admission medications   Medication Sig Start Date End Date Taking? Authorizing Provider  aspirin EC 81 MG tablet Take 81 mg by mouth daily.    [provider]  b complex vitamins capsule Take 1 capsule by mouth daily.    [provider]  BD PEN NEEDLE NANO U/F 32G X 4 MM MISC as directed. 03/26/12   [provider]  clopidogrel (PLAVIX) 75 MG tablet TAKE 1 TABLET BY MOUTH EVERY DAY Patient taking differently: Take 75 mg by mouth daily.  02/07/19   Burnell Blanks, MD  doxycycline (VIBRA-TABS) 100 MG tablet TAKE 1 TABLET  BY MOUTH TWICE A DAY 01/27/20   Sheffield, Vida Roller R, PA-C  insulin aspart (NOVOLOG) 100 UNIT/ML injection Inject 4-13 Units into the skin 3 (three) times daily with meals. Sliding scale: If blood glucose is 70 to 130: take 4 units at beginning of meal.  CBG 131 to 160: 5 units.   161 to 190: 6 units.   191 to 220: 7 units.   221 to 250: 8 units.   251 to 280:  9 units.   281 to 310: then 10 units.   311 to 340: 11 units.   341 to 370: 12 units.   371 to 400: 13 units.   Over 400 mg/dL: 13 units and notify your MD 03/06/12   Rai, Vernelle Emerald, MD  insulin glargine (LANTUS) 100 UNIT/ML injection Inject 15 Units into the skin as needed. Backup for Insulin Pump    [provider]  Insulin Pen Needle 32G X 4 MM MISC by Does not apply route.     [provider]  lisinopril (PRINIVIL,ZESTRIL) 2.5 MG tablet Take 2.5 mg by mouth daily. 01/23/15   [provider]  metoprolol tartrate (LOPRESSOR) 25 MG tablet Take 1 tablet (25 mg total) by mouth 2 (two) times daily. 01/27/20   Burnell Blanks, MD  nitroGLYCERIN (NITROSTAT) 0.4 MG SL tablet Place 0.4 mg under the tongue every 5 (five) minutes as needed for chest pain. Up to 3 times then call 911    [provider]  NOVOLOG 100 UNIT/ML injection 1 Units continuous. INSULIN PUMP I unit every hour on the hour Test five times a day B/s over 100 get an acute unit of one unit per hundred 01/24/15   [provider]  rosuvastatin (CRESTOR) 10 MG tablet TAKE 1 TABLET BY MOUTH DAILY. PLEASE KEEP UPCOMING APPT IN MAY FOR FUTURE REFILLS. THANK YOU 10/21/19   Burnell Blanks, MD    Allergies    Patient has no known allergies.  Review of Systems   Review of Systems  Unable to perform ROS: Other    Physical Exam Updated Vital Signs There were no vitals taken for this visit.  Physical Exam Vitals and nursing note reviewed.  Constitutional:      General: He is not in acute distress.    Appearance: He is well-developed. He is not diaphoretic.  HENT:     Head: Normocephalic.     Comments: There are superficial abrasions to the right temporal area.  There is no palpable abnormality. Eyes:     Extraocular Movements: Extraocular movements intact.     Pupils: Pupils are equal, round, and reactive to light.  Cardiovascular:     Rate and Rhythm: Normal rate and regular rhythm.     Heart sounds: No murmur heard.  No friction rub.  Pulmonary:     Effort: Pulmonary effort is normal. No respiratory distress.     Breath sounds: Normal breath sounds. No wheezing or rales.  Abdominal:     General: Bowel sounds are normal. There is no distension.     Palpations: Abdomen is soft.     Tenderness: There is no abdominal tenderness.  Musculoskeletal:         General: Normal range of motion.     Cervical back: Normal range of motion and neck supple.  Skin:    General: Skin is warm and dry.  Neurological:     General: No focal deficit present.     Mental Status: He is alert and oriented  to person, place, and time.     Cranial Nerves: No cranial nerve deficit.     Motor: No weakness.     Coordination: Coordination normal.     ED Results / Procedures / Treatments   Labs (all labs ordered are listed, but only abnormal results are displayed) Labs Reviewed  BASIC METABOLIC PANEL  CBC WITH DIFFERENTIAL/PLATELET  ETHANOL    EKG None  Radiology No results found.  Procedures Procedures (including critical care time)  Medications Ordered in ED Medications - No data to display  ED Course  I have reviewed the triage vital signs and the nursing notes.  Pertinent labs & imaging results that were available during my care of the patient were reviewed by me and considered in my medical decision making (see chart for details).    MDM Rules/Calculators/A&P  Patient is a 64 year old male brought here for evaluation of fall while intoxicated.  Patient struck his head and has abrasions to the right temporoparietal region.  His CT scan is unremarkable of both his cervical spine and head.  Laboratory studies show a blood alcohol of 280 and glucose of 444.  Patient given subcutaneous insulin with some improvement of his sugar.  He was observed overnight and appears suitable for discharge.  He is to follow-up as needed.  Final Clinical Impression(s) / ED Diagnoses Final diagnoses:  None    Rx / DC Orders ED Discharge Orders    None       Veryl Speak, MD 01/29/20 [phone]

## 2020-01-29 ENCOUNTER — Emergency Department (HOSPITAL_COMMUNITY): Payer: Managed Care, Other (non HMO)

## 2020-01-29 ENCOUNTER — Other Ambulatory Visit (HOSPITAL_COMMUNITY): Payer: Managed Care, Other (non HMO)

## 2020-01-29 LAB — BASIC METABOLIC PANEL
Anion gap: 14 (ref 5–15)
BUN: 12 mg/dL (ref 8–23)
CO2: 20 mmol/L — ABNORMAL LOW (ref 22–32)
Calcium: 8.7 mg/dL — ABNORMAL LOW (ref 8.9–10.3)
Chloride: 98 mmol/L (ref 98–111)
Creatinine, Ser: 1.03 mg/dL (ref 0.61–1.24)
GFR calc Af Amer: >60 mL/min (ref >60)
GFR calc non Af Amer: >60 mL/min (ref >60)
Glucose, Bld: 444 mg/dL — ABNORMAL HIGH (ref 70–99)
Potassium: 4.1 mmol/L (ref 3.5–5.1)
Sodium: 132 mmol/L — ABNORMAL LOW (ref 135–145)

## 2020-01-29 LAB — CBG MONITORING, ED
Glucose-Capillary: 394 mg/dL — ABNORMAL HIGH (ref 70–99)
Glucose-Capillary: 394 mg/dL — ABNORMAL HIGH (ref 70–99)

## 2020-01-29 LAB — ETHANOL: Alcohol, Ethyl (B): 280 mg/dL — ABNORMAL HIGH (ref <10)

## 2020-01-29 MED ORDER — INSULIN ASPART 100 UNIT/ML ~~LOC~~ SOLN
6.0000 [IU] | Freq: Once | SUBCUTANEOUS | Status: AC
  Administered 2020-01-29: 6 [IU] via SUBCUTANEOUS

## 2020-01-29 NOTE — ED Notes (Signed)
Pt tried to get out of bed and pulled out IV. Pt assisted back into bed.

## 2020-01-29 NOTE — ED Notes (Signed)
Discharge instructions reviewed with pt. Pt verbalized understanding.  Pt son, Hilliard Clark, coming to pick up pt.

## 2020-01-29 NOTE — Discharge Instructions (Addendum)
Follow-up with primary doctor if you experience any new and/or concerning symptoms.

## 2020-02-01 ENCOUNTER — Ambulatory Visit (HOSPITAL_COMMUNITY): Payer: Managed Care, Other (non HMO) | Attending: Cardiology

## 2020-02-01 ENCOUNTER — Other Ambulatory Visit: Payer: Self-pay

## 2020-02-01 DIAGNOSIS — I25810 Atherosclerosis of coronary artery bypass graft(s) without angina pectoris: Secondary | ICD-10-CM | POA: Diagnosis not present

## 2020-02-01 DIAGNOSIS — E7800 Pure hypercholesterolemia, unspecified: Secondary | ICD-10-CM | POA: Diagnosis not present

## 2020-02-17 ENCOUNTER — Telehealth: Payer: Self-pay | Admitting: Cardiovascular Disease

## 2020-02-17 NOTE — Telephone Encounter (Signed)
New Message    *STAT* If patient is at the pharmacy, call can be transferred to refill team.   1. Which medications need to be refilled? (please list name of each medication and dose if known) clopidogrel (PLAVIX) 75 MG tablet, rosuvastatin (CRESTOR) 10 MG tablet, metoprolol tartrate (LOPRESSOR) 25 MG tablet, aspirin EC 81 MG tablet        2. Which pharmacy/location (including street and city if local pharmacy) is medication to be sent to?[id number] - [place], [place]  3. Do they need a 30 day or 90 day supply? Patient needs a rx sent that will allow 10 refills. [place] and his insurace is working with him to obtain these refills as an override.

## 2020-02-20 MED ORDER — METOPROLOL TARTRATE 25 MG PO TABS: 25.0000 mg | ORAL_TABLET | Freq: Two times a day (BID) | ORAL | 11 refills | Status: DC

## 2020-02-20 MED ORDER — ASPIRIN EC 81 MG PO TBEC: 81.0000 mg | DELAYED_RELEASE_TABLET | Freq: Every day | ORAL | 11 refills | Status: AC

## 2020-02-20 MED ORDER — ROSUVASTATIN CALCIUM 10 MG PO TABS: ORAL_TABLET | ORAL | 11 refills | Status: DC

## 2020-02-20 MED ORDER — CLOPIDOGREL BISULFATE 75 MG PO TABS: 75.0000 mg | ORAL_TABLET | Freq: Every day | ORAL | 11 refills | Status: DC

## 2020-02-20 NOTE — Telephone Encounter (Signed)
Pt's medications were sent to pt's pharmacy as requested. Confirmation received.  

## 2020-03-13 ENCOUNTER — Other Ambulatory Visit: Payer: Self-pay | Admitting: Physician Assistant

## 2020-03-13 ENCOUNTER — Other Ambulatory Visit: Payer: Self-pay

## 2020-03-13 MED ORDER — ROSUVASTATIN CALCIUM 10 MG PO TABS: ORAL_TABLET | ORAL | 2 refills | Status: DC

## 2020-03-13 NOTE — Telephone Encounter (Signed)
You can attempt it.  Unsure if they will do it.

## 2020-03-13 NOTE — Telephone Encounter (Signed)
Pt's medication was sent to pt's pharmacy as requested. Confirmation received.  °

## 2020-03-13 NOTE — Telephone Encounter (Signed)
Left message for patient to return our call.

## 2020-03-13 NOTE — Telephone Encounter (Signed)
Patient will be leaving 03/22/2020 to go [place] of [place].  Patient would like 10 months worth [facility]  [address]  (Chart [id number])

## 2020-03-14 MED ORDER — DOXYCYCLINE HYCLATE 100 MG PO TABS: 100.0000 mg | ORAL_TABLET | Freq: Two times a day (BID) | ORAL | 3 refills | Status: DC

## 2020-03-14 NOTE — Telephone Encounter (Signed)
Patient left message on office voice mail returning call.

## 2020-03-14 NOTE — Telephone Encounter (Signed)
Calling to let patient know prescription was sent to pharmacy.

## 2020-05-24 ENCOUNTER — Telehealth: Payer: Self-pay | Admitting: Dietician

## 2020-05-24 NOTE — Telephone Encounter (Signed)
Hi Jiovanny, I reached out to [facility] [phone]) and this is what I was able to find out. You are on the older model of Omnipod.  [facility] has not contracted with [employer] since February 2021. If you wish to stay on your current upgrade you can obtain supplies from [facility] [phone]) or [facility] [phone]).  Some DME suppliers take a long time...  Your current Omnipod warranty has expired and you are eligible to upgrade to the DASH (which is easier to use).  To do this you would need a prescription for the Omnipod DASH and supplies from Dr. Buddy Duty as well as [facility] requires a prior authorization from your MD.  The rep stated that [facility] is very fast with shipment of supplies in 48 hours.  She also stated that there should not be a problem obtaining this with the prior authorization.  She also stated that others find this this is financially a good choice.  Per my discussion with you, you would like to change to the DASH system.  I will send a request for the prescription for the Omnipod DASH and 60 pods to Dr. Buddy Duty as well as a request for preauthorization to [facility] [place].  You can continue to use the [facility] and will continue to enter your blood sugar and carbs into the personal devise for the DASH.  I called [employer] who will request your new prescription and setting updates from Dr. Buddy Duty.  Once received, they can send this out.  Please let me know if you have any questions. Antonieta Iba, RD, CDCES  -----Original Message----- From: Jordan Waller <[email or web address]>  Sent: Wednesday, May 23, 2020 10:42 AM To: Antonieta Iba <[email or web address]> Subject: Recommendation for Omnipod  *Caution - External email - see footer for warnings*  I am Jordan Waller, a patient of Dr. Buddy Duty. I am in need of a new dealer/distributor of Omnipods that accepts [employer]. I am asking if you have any recommendations. I am home from [place] for 3 weeks and need  5 boxes of 10/box to take back with me. Appreciate any direction you may provide. Thank you.  Jordan Waller

## 2021-01-04 ENCOUNTER — Ambulatory Visit: Payer: Managed Care, Other (non HMO) | Admitting: Physician Assistant

## 2021-01-09 ENCOUNTER — Ambulatory Visit: Payer: Managed Care, Other (non HMO) | Admitting: Physician Assistant

## 2021-01-25 IMAGING — CT CT CERVICAL SPINE W/O CM
4 series · 16 of 33 positions shown, 18 images · non-contrast
Comparison: None.

CLINICAL DATA: Fall down stairs

EXAM:
CT CERVICAL SPINE WITHOUT CONTRAST
TECHNIQUE: Multidetector CT imaging of the cervical spine was performed without
intravenous contrast. Multiplanar CT image reconstructions were also
generated.

[Series 5: c spine soft · axial · 0.42mm/px · z∈[-258,-170]mm · 4 of 104 slices shown]
[im 15/104  soft-tissue]
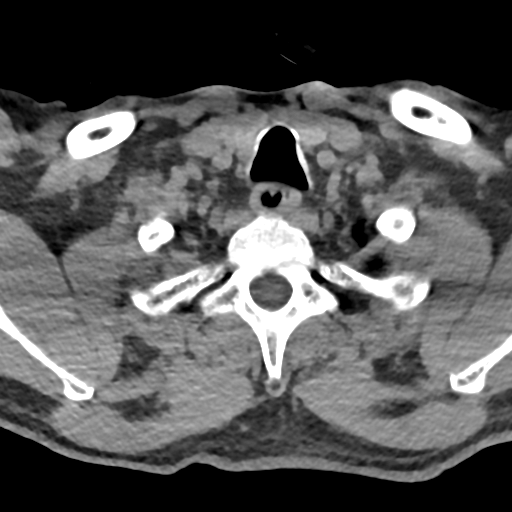
[im 30/104  soft-tissue]
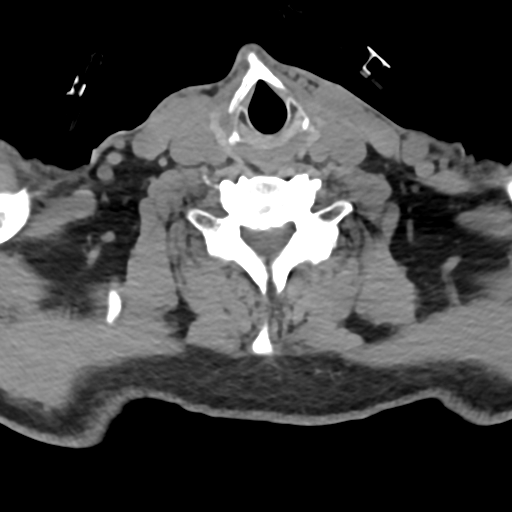
[im 45/104  soft-tissue]
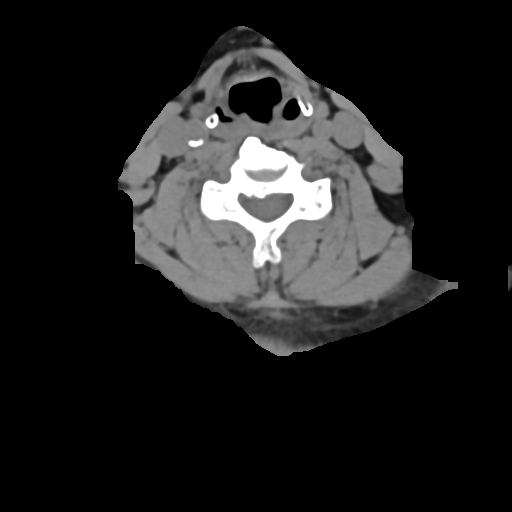
[im 59/104  soft-tissue]
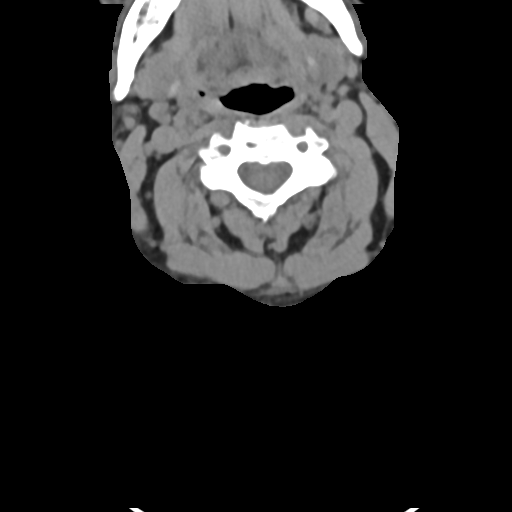

[Series 8: sag bone · sagittal · 0.36mm/px · 5 of 88 slices shown, 6 images]
[im 30/88  bone]
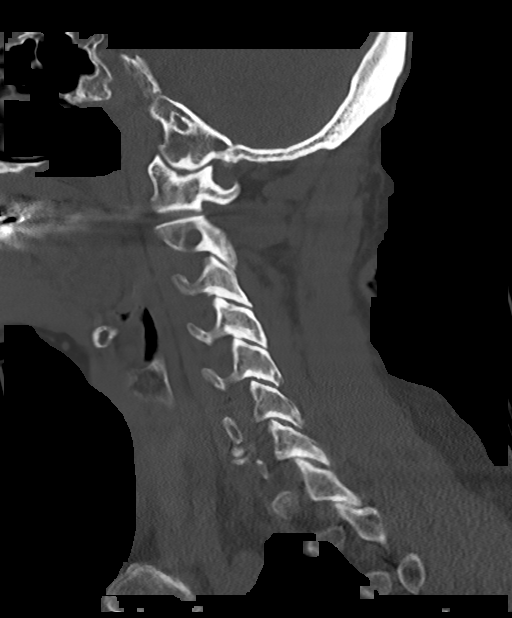
[im 37/88  bone]
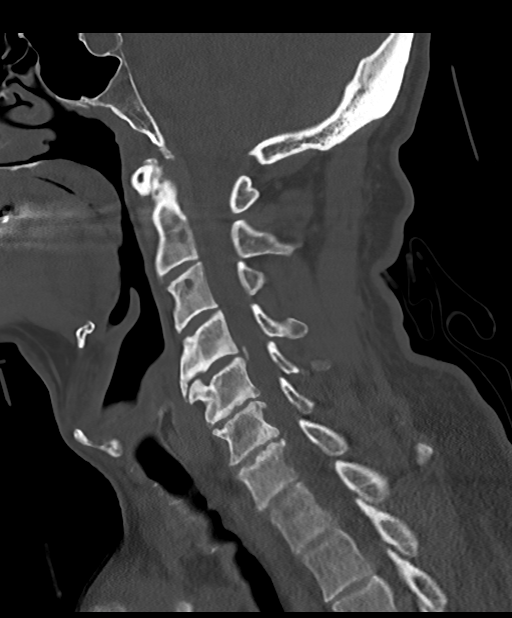
[im 44/88  soft-tissue]
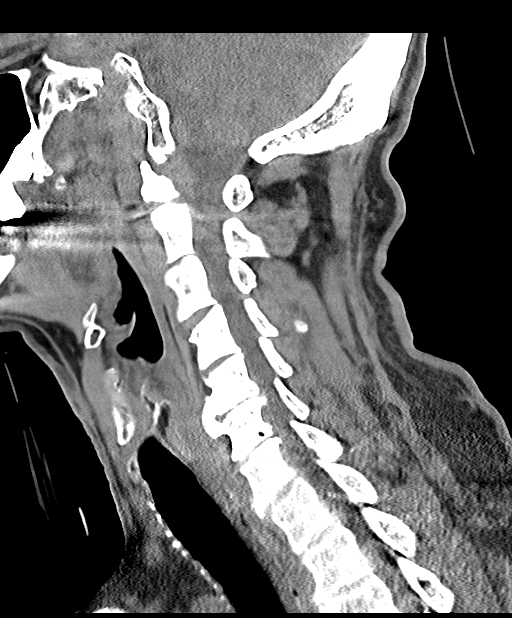
[im 44/88  bone]
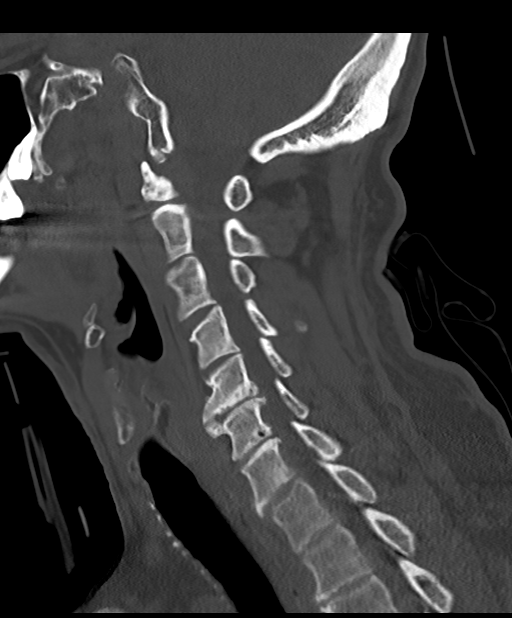
[im 51/88  bone]
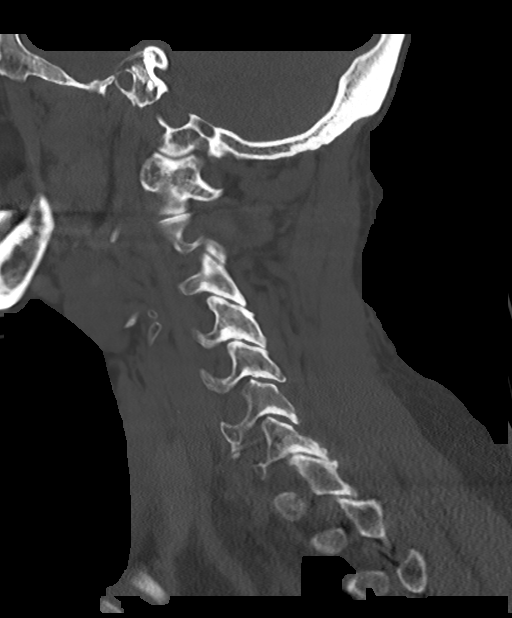
[im 59/88  bone]
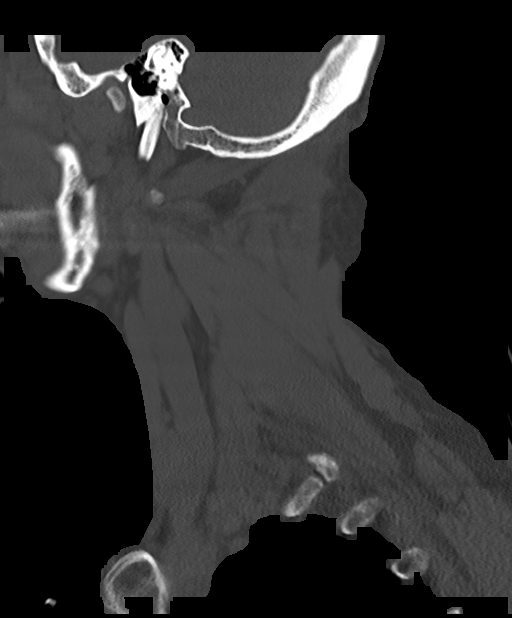

[Series 9: cor bone · coronal · 0.37mm/px · 3 of 75 slices shown]
[im 21/75  bone]
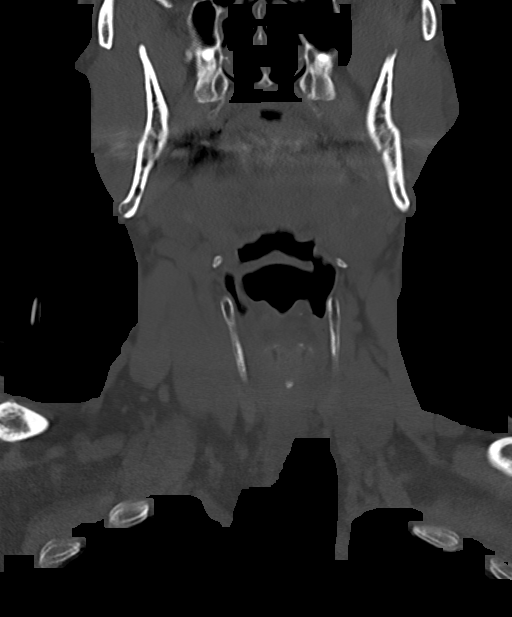
[im 32/75  bone]
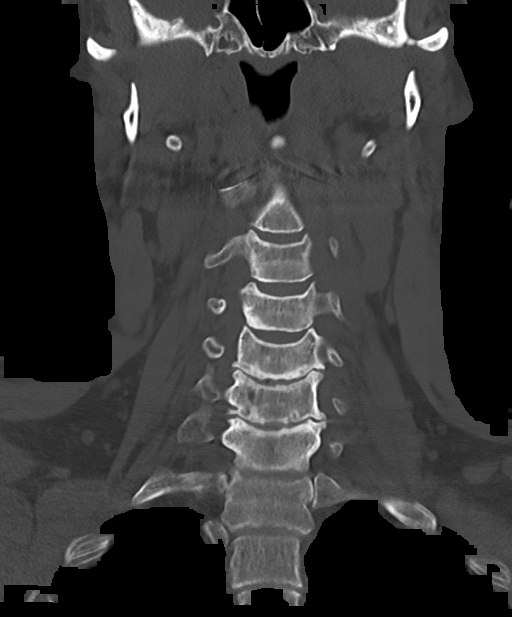
[im 43/75  bone]
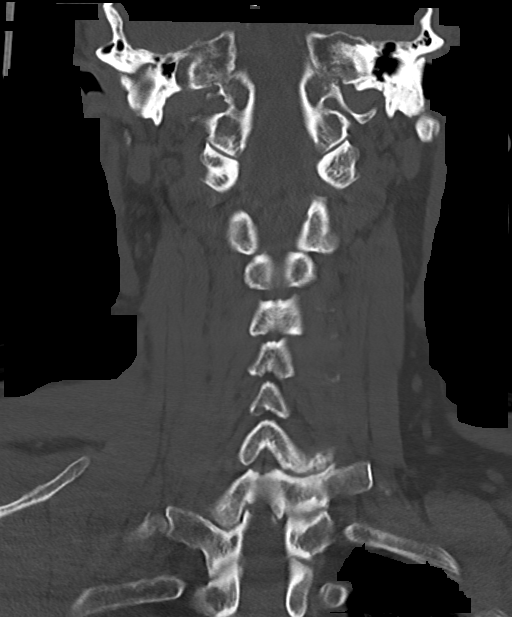

[Series 10: orthogonal axials · axial · 0.21mm/px · z∈[-255,-161]mm · 4 of 84 slices shown, 5 images]
[im 17/84  soft-tissue]
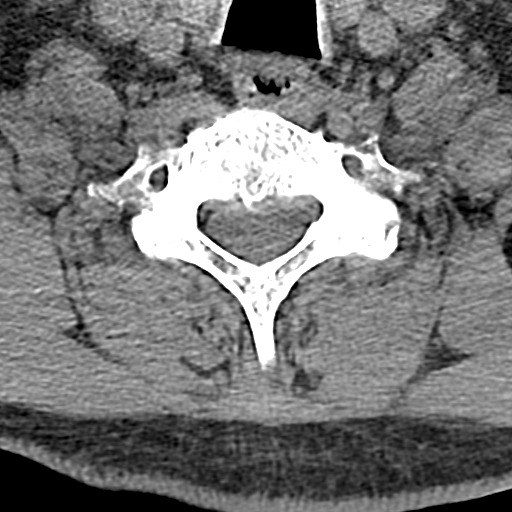
[im 17/84  bone]
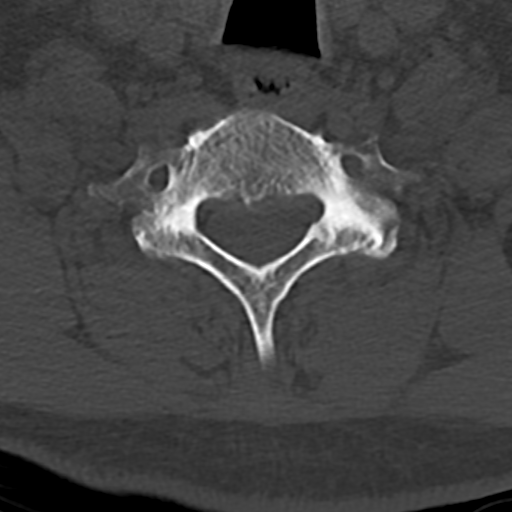
[im 34/84  bone]
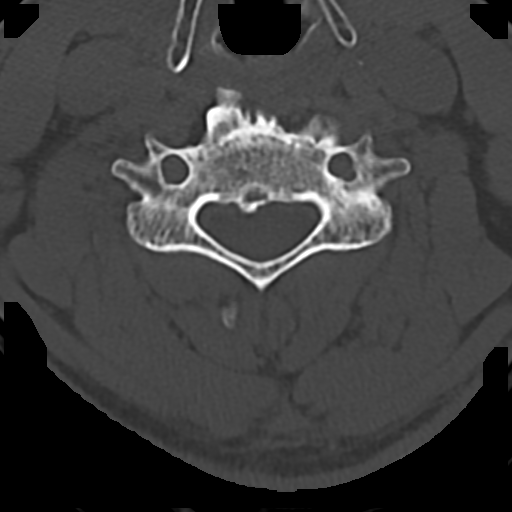
[im 50/84  bone]
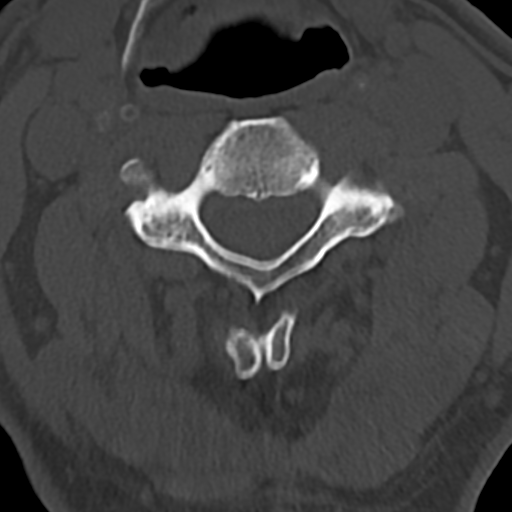
[im 67/84  bone]
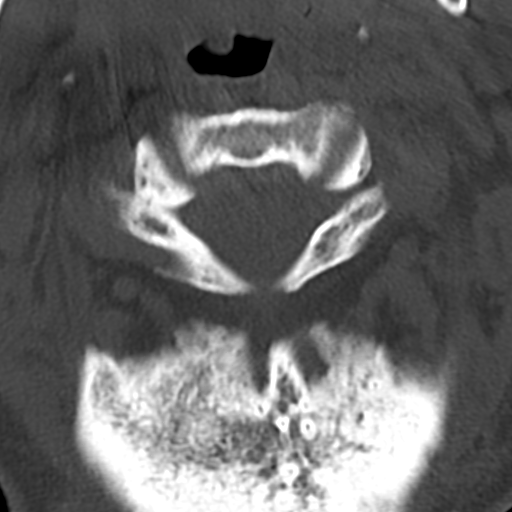

[16 of 33 positions shown; findings below may reference images not displayed]

FINDINGS: Alignment: No static subluxation. Facets are aligned. Occipital
condyles and the lateral masses of C1 and C2 are normally
approximated.

Skull base and vertebrae: No acute fracture.

Soft tissues and spinal canal: No prevertebral fluid or swelling. No
visible canal hematoma.

Disc levels: No advanced spinal canal or neural foraminal stenosis.

Upper chest: No pneumothorax, pulmonary nodule or pleural effusion.

Other: Normal visualized paraspinal cervical soft tissues.
IMPRESSION: No acute fracture or static subluxation of the cervical spine.

## 2021-02-07 ENCOUNTER — Telehealth: Payer: Self-pay | Admitting: Physician Assistant

## 2021-02-07 NOTE — Telephone Encounter (Signed)
Referral scheduled w/KRS 02/19/21 @8 :45

## 2021-02-07 NOTE — Telephone Encounter (Signed)
Referral attached to appointment

## 2021-02-19 ENCOUNTER — Encounter: Payer: Self-pay | Admitting: Physician Assistant

## 2021-02-19 ENCOUNTER — Other Ambulatory Visit: Payer: Self-pay

## 2021-02-19 ENCOUNTER — Ambulatory Visit: Payer: Managed Care, Other (non HMO) | Admitting: Physician Assistant

## 2021-02-19 DIAGNOSIS — L608 Other nail disorders: Secondary | ICD-10-CM

## 2021-02-19 DIAGNOSIS — Z1283 Encounter for screening for malignant neoplasm of skin: Secondary | ICD-10-CM | POA: Diagnosis not present

## 2021-02-19 DIAGNOSIS — C44719 Basal cell carcinoma of skin of left lower limb, including hip: Secondary | ICD-10-CM

## 2021-02-19 DIAGNOSIS — L570 Actinic keratosis: Secondary | ICD-10-CM

## 2021-02-19 DIAGNOSIS — D485 Neoplasm of uncertain behavior of skin: Secondary | ICD-10-CM

## 2021-02-19 NOTE — Progress Notes (Signed)
   Follow-Up Visit   Subjective  Jordan Waller is a 65 y.o. male who presents for the following: Skin Problem (Patients endocrinologist wants his right big toe examined. Discoloration x 1 year no symptoms. Also left calf lesion x 1 year not healing. No pain or bleeding. ).   The following portions of the chart were reviewed this encounter and updated as appropriate:  Tobacco  Allergies  Meds  Problems  Med Hx  Surg Hx  Fam Hx      Objective  Well appearing patient in no apparent distress; mood and affect are within normal limits.  A full examination was performed including scalp, head, eyes, ears, nose, lips, neck, chest, axillae, abdomen, back, buttocks, bilateral upper extremities, bilateral lower extremities, hands, feet, fingers, toes, fingernails, and toenails. All findings within normal limits unless otherwise noted below.  Left Mid Helix (2), Right Mid Helix (2) Erythematous patches with gritty scale.  Neck - Anterior Bichromic dark nested macule.      Left Lower Leg - Anterior Pink plaque       Right Hallux Toenail Yellow discoloration with numerous dark patches      Assessment & Plan  AK (actinic keratosis) (4) Left Mid Helix (2); Right Mid Helix (2)  Destruction of lesion - Left Mid Helix, Right Mid Helix Complexity: simple   Destruction method: cryotherapy   Informed consent: discussed and consent obtained   Timeout:  patient name, date of birth, surgical site, and procedure verified Lesion destroyed using liquid nitrogen: Yes   Cryotherapy cycles:  1 Outcome: patient tolerated procedure well with no complications   Post-procedure details: wound care instructions given    Neoplasm of uncertain behavior of skin (2) Neck - Anterior  Skin / nail biopsy Type of biopsy: tangential   Informed consent: discussed and consent obtained   Timeout: patient name, date of birth, surgical site, and procedure verified   Procedure prep:  Patient was  prepped and draped in usual sterile fashion (Non sterile) Prep type:  Chlorhexidine Anesthesia: the lesion was anesthetized in a standard fashion   Anesthetic:  1% lidocaine w/ epinephrine 1-100,000 local infiltration Instrument used: flexible razor blade   Outcome: patient tolerated procedure well   Post-procedure details: wound care instructions given    Specimen 1 - Surgical pathology Differential Diagnosis: r/o atypia  Check Margins: No  Left Lower Leg - Anterior  Skin / nail biopsy Type of biopsy: tangential   Informed consent: discussed and consent obtained   Timeout: patient name, date of birth, surgical site, and procedure verified   Procedure prep:  Patient was prepped and draped in usual sterile fashion (Non sterile) Prep type:  Chlorhexidine Anesthesia: the lesion was anesthetized in a standard fashion   Anesthetic:  1% lidocaine w/ epinephrine 1-100,000 local infiltration Instrument used: flexible razor blade   Outcome: patient tolerated procedure well   Post-procedure details: wound care instructions given    Specimen 2 - Surgical pathology Differential Diagnosis: bcc vs scc  Check Margins: No  Melanonychia Right Hallux Toenail  Ambulatory referral to Podiatry - Right Hallux Toenail    I, Camilla Skeen, PA-C, have reviewed all documentation's for this visit.  The documentation on 02/19/21 for the exam, diagnosis, procedures and orders are all accurate and complete.

## 2021-02-25 ENCOUNTER — Other Ambulatory Visit: Payer: Self-pay

## 2021-02-25 MED ORDER — CLOPIDOGREL BISULFATE 75 MG PO TABS: 75.0000 mg | ORAL_TABLET | Freq: Every day | ORAL | 0 refills | Status: DC

## 2021-02-25 MED ORDER — LISINOPRIL 2.5 MG PO TABS: 2.5000 mg | ORAL_TABLET | Freq: Every day | ORAL | 0 refills | Status: DC

## 2021-02-25 MED ORDER — ROSUVASTATIN CALCIUM 10 MG PO TABS: ORAL_TABLET | ORAL | 0 refills | Status: DC

## 2021-02-25 MED ORDER — METOPROLOL TARTRATE 25 MG PO TABS: 25.0000 mg | ORAL_TABLET | Freq: Two times a day (BID) | ORAL | 0 refills | Status: DC

## 2021-02-26 ENCOUNTER — Telehealth: Payer: Self-pay | Admitting: Physician Assistant

## 2021-02-26 MED ORDER — DOXYCYCLINE HYCLATE 100 MG PO TABS: 100.0000 mg | ORAL_TABLET | Freq: Two times a day (BID) | ORAL | 3 refills | Status: DC

## 2021-02-26 MED ORDER — MUPIROCIN 2 % EX OINT
1.0000 | TOPICAL_OINTMENT | Freq: Every day | CUTANEOUS | 3 refills | Status: AC
Start: 2021-02-26 — End: ?

## 2021-02-26 NOTE — Telephone Encounter (Signed)
Wife said pharmacy ([facility]) told her to call us for refill on mupirocin 2% and also needs refill on doxy.

## 2021-02-26 NOTE — Telephone Encounter (Signed)
Phone call from patient's wife wanting pathology results and also wanting to know if Robyne Askew, PA will refill the patient's doxycycline for his acne on his back and mupirocin for the wound on his leg?  Per Robyne Askew okay to refill.  MOH's info sent to [facility], and refills sent in.

## 2021-02-26 NOTE — Telephone Encounter (Signed)
-----   Message from Warren Danes, [place] sent at 02/26/2021  2:39 PM EDT ----- mohs

## 2021-02-26 NOTE — Telephone Encounter (Signed)
Phone call to patient's wife regarding medication refills. Voicemail left for patient's wife to give the office a call back.

## 2021-03-11 ENCOUNTER — Other Ambulatory Visit: Payer: Self-pay

## 2021-03-11 ENCOUNTER — Ambulatory Visit: Payer: Managed Care, Other (non HMO) | Admitting: Podiatry

## 2021-03-11 DIAGNOSIS — B351 Tinea unguium: Secondary | ICD-10-CM | POA: Diagnosis not present

## 2021-03-11 DIAGNOSIS — L600 Ingrowing nail: Secondary | ICD-10-CM

## 2021-03-11 NOTE — Progress Notes (Signed)
Subjective:   Patient ID: Jordan Waller, male   DOB: 65 y.o.   MRN: [id number]   HPI Patient presents stating he is concerned about discoloration of his right big toenail that its been this way for 9 months he has had premalignant lesions removed in other parts of his body and he is a type I diabetic and is concerned about melanoma.  Patient presents with caregiver and does not smoke likes to be active   Review of Systems  All other systems reviewed and are negative.      Objective:  Physical Exam Vitals and nursing note reviewed.  Constitutional:      Appearance: He is well-developed.  Pulmonary:     Effort: Pulmonary effort is normal.  Musculoskeletal:        General: Normal range of motion.  Skin:    General: Skin is warm.  Neurological:     Mental Status: He is alert.    Neurovascular status intact muscle strength found to be adequate range of motion adequate.  Patient is noted to have a damaged right hallux nail bed distal two thirds mildly loose on the surface and painful when pressed.  Patient thinks he wore a tight golf shoe and it seemed to occur after that but not sure and has good digital perfusion well oriented x3 with no indication diabetic angiopathy or neuropathy     Assessment:  Damaged right hallux nail bed with slight possibility of melanoma but very unlikely     Plan:  H&P spent a great deal time going over possibilities of what this could be and they are much more comfortable [place] to review pathology.  I anesthetized the right hallux I then remove the distal two thirds of the nail right and I sent this off for pathological evaluation for both fungal cultures and melanoma and I applied sterile dressing.  I gave instructions on soaks reappoint and we will let them know when we get the results

## 2021-03-21 ENCOUNTER — Other Ambulatory Visit: Payer: Self-pay | Admitting: Cardiovascular Disease

## 2021-03-26 ENCOUNTER — Other Ambulatory Visit: Payer: Self-pay | Admitting: *Deleted

## 2021-03-26 DIAGNOSIS — B351 Tinea unguium: Secondary | ICD-10-CM

## 2021-04-04 ENCOUNTER — Encounter (HOSPITAL_BASED_OUTPATIENT_CLINIC_OR_DEPARTMENT_OTHER): Payer: Self-pay | Admitting: Family

## 2021-04-04 ENCOUNTER — Ambulatory Visit (HOSPITAL_BASED_OUTPATIENT_CLINIC_OR_DEPARTMENT_OTHER): Payer: Managed Care, Other (non HMO) | Admitting: Family

## 2021-04-04 ENCOUNTER — Other Ambulatory Visit: Payer: Self-pay

## 2021-04-04 ENCOUNTER — Telehealth: Payer: Self-pay | Admitting: Physician Assistant

## 2021-04-04 VITALS — BP 112/64 | HR 62 | Ht 67.0 in | Wt 166.0 lb

## 2021-04-04 DIAGNOSIS — I25118 Atherosclerotic heart disease of native coronary artery with other forms of angina pectoris: Secondary | ICD-10-CM | POA: Diagnosis not present

## 2021-04-04 DIAGNOSIS — E785 Hyperlipidemia, unspecified: Secondary | ICD-10-CM | POA: Diagnosis not present

## 2021-04-04 DIAGNOSIS — I10 Essential (primary) hypertension: Secondary | ICD-10-CM | POA: Diagnosis not present

## 2021-04-04 MED ORDER — CLOPIDOGREL BISULFATE 75 MG PO TABS: 75.0000 mg | ORAL_TABLET | Freq: Every day | ORAL | 12 refills | Status: DC

## 2021-04-04 MED ORDER — METOPROLOL TARTRATE 25 MG PO TABS: 25.0000 mg | ORAL_TABLET | Freq: Two times a day (BID) | ORAL | 12 refills | Status: DC

## 2021-04-04 MED ORDER — LISINOPRIL 2.5 MG PO TABS: 2.5000 mg | ORAL_TABLET | Freq: Every day | ORAL | 12 refills | Status: DC

## 2021-04-04 MED ORDER — NITROGLYCERIN 0.4 MG SL SUBL: 0.4000 mg | SUBLINGUAL_TABLET | SUBLINGUAL | 12 refills | Status: AC | PRN

## 2021-04-04 MED ORDER — DOXYCYCLINE HYCLATE 100 MG PO TABS: 100.0000 mg | ORAL_TABLET | Freq: Two times a day (BID) | ORAL | 0 refills | Status: AC

## 2021-04-04 MED ORDER — ROSUVASTATIN CALCIUM 10 MG PO TABS: ORAL_TABLET | ORAL | 0 refills | Status: DC

## 2021-04-04 NOTE — Progress Notes (Signed)
Office Visit    Patient Name: Jordan Waller Date of Encounter: 04/04/2021  PCP:  Lawerance Cruel, [place]  Cardiologist:  Lauree Chandler, MD  Advanced Practice Provider:  No care team member to display Electrophysiologist:  None   Chief Complaint    Jordan Waller is a 65 y.o. male with a hx of CAD, DM2, hyperlipidemia, former tobacco use presents today for follow-up of CAD  Past Medical History    Past Medical History:  Diagnosis Date   CAD (coronary artery disease)    NSTEMI August 2013, DES mid LAD   Chronic folliculitis    Diabetes mellitus 2012   on insulin pump throughout   High cholesterol    Hypertension    Past Surgical History:  Procedure Laterality Date   LEFT HEART CATHETERIZATION WITH CORONARY ANGIOGRAM N/A 03/04/2012   Procedure: LEFT HEART CATHETERIZATION WITH CORONARY ANGIOGRAM;  Surgeon: Burnell Blanks, MD;  Location: [facility] CATH LAB;  Service: Cardiovascular;  Laterality: N/A;   ROTATOR CUFF REPAIR     TONSILLECTOMY AND ADENOIDECTOMY      Allergies  No Known Allergies  History of Present Illness    Jordan Waller is a 66 y.o. male with a hx of CAD, DM2, HLD, former tobacco use last seen 01/11/2020 by Dr. Angelena Form.  He [facility] with weakness, nausea, vomiting, pain, elevated blood sugars.  Found to be in DKA.  Cardiac catheterization 03/04/2022 with 99% mid LAD stenosis which was treated with a 3 x 12m Promus element DES.  Normal exercise stress test December 2017.  He was last seen 01/10/2020 doing well from a cardiac perspective. He presents today for follow up. [place] and enjoys weightlifting. He tends to be less active and eat out more when [place] back [place] Reports no shortness of breath nor dyspnea on exertion. Reports no chest pain,  pressure, or tightness. No edema, orthopnea, PND. Reports no palpitations.    EKGs/Labs/Other Studies Reviewed:   The following studies were reviewed today:  Cardiac cath 03/04/12:  Left main: No disease.  Left Anterior Descending Artery: Large caliber vessel that courses to the apex. The ostium has 25% stenosis. The proximal vessel has luminal irregularities. The mid vessel has a 99% stenosis. The distal vessel has no obstructive disease. There is a moderate sized diagonal branch with no disease noted.  Circumflex Artery: Moderate sized vessel with moderate sized first obtuse marginal branch. The AV groove Circumflex is moderate sized beyond the OM branch. No disease noted.  Right Coronary Artery: Large, dominant vessel with mild luminal irregularities in the proximal/mid and distal vessel. The PDA has 20% ostial stenosis.  Left Ventricular Angiogram: LVEF 40-45% with hypokinesis of the anterior wall and apex.  Impression:  1. Single vessel CAD, now s/p successful PTCA/DES x 1 mid LAD  2. Segmental LV systolic dysfunction  3. NSTEMI    Echo 09/06/12: Left ventricle: The cavity size was normal. Wall thickness was normal. Systolic function was normal. The estimated ejection fraction was in the range of 60% to 65%. Doppler parameters are consistent with abnormal left ventricular relaxation (grade 1 diastolic dysfunction). - Mitral valve: Calcified annulus. Mildly thickened leaflets    EKG:  EKG is  ordered today.  The ekg ordered today  demonstrates NSR 62bpm.   Recent Labs: No results found for requested labs within last 8760 hours.  Recent Lipid Panel    Component Value Date/Time   CHOL 147 05/08/2015 0839   TRIG 71 05/08/2015 0839   HDL 54 05/08/2015 0839   CHOLHDL 2.7 05/08/2015 0839   VLDL 14 05/08/2015 0839   LDLCALC 79 05/08/2015 0839   Home Medications   Current Meds  Medication Sig   aspirin EC 81 MG tablet Take 1 tablet (81 mg total) by mouth daily.   b complex  vitamins capsule Take 1 capsule by mouth daily.   BD PEN NEEDLE NANO U/F 32G X 4 MM MISC as directed.   clopidogrel (PLAVIX) 75 MG tablet TAKE 1 TABLET BY MOUTH EVERY DAY   doxycycline (VIBRA-TABS) 100 MG tablet Take 1 tablet (100 mg total) by mouth 2 (two) times daily.   insulin aspart (NOVOLOG) 100 UNIT/ML injection Inject 4-13 Units into the skin 3 (three) times daily with meals. Sliding scale: If blood glucose is 70 to 130: take 4 units at beginning of meal.  CBG 131 to 160: 5 units.   161 to 190: 6 units.   191 to 220: 7 units.   221 to 250: 8 units.   251 to 280:  9 units.   281 to 310: then 10 units.   311 to 340: 11 units.   341 to 370: 12 units.   371 to 400: 13 units.   Over 400 mg/dL: 13 units and notify your MD   insulin glargine (LANTUS) 100 UNIT/ML injection Inject 15 Units into the skin as needed. Backup for Insulin Pump   Insulin Pen Needle 32G X 4 MM MISC by Does not apply route.   lisinopril (ZESTRIL) 2.5 MG tablet TAKE 1 TABLET BY MOUTH EVERY DAY   metoprolol tartrate (LOPRESSOR) 25 MG tablet TAKE 1 TABLET BY MOUTH TWICE A DAY   mupirocin ointment (BACTROBAN) 2 % Apply 1 application topically daily.   nitroGLYCERIN (NITROSTAT) 0.4 MG SL tablet Place 0.4 mg under the tongue every 5 (five) minutes as needed for chest pain. Up to 3 times then call 911   NOVOLOG 100 UNIT/ML injection 1 Units continuous. INSULIN PUMP I unit every hour on the hour Test five times a day B/s over 100 get an acute unit of one unit per hundred   rosuvastatin (CRESTOR) 10 MG tablet TAKE 1 TABLET BY MOUTH EVERY DAY     Review of Systems      All other systems reviewed and are otherwise negative except as noted above.  Physical Exam    VS:  BP 112/64   Pulse 62   Ht '5\' 7"'$  (1.702 m)   Wt 166 lb (75.3 kg)   SpO2 99%   BMI 26.00 kg/m  , BMI Body mass index is 26 kg/m.  Wt Readings from Last 3 Encounters:  04/04/21 166 lb (75.3 kg)  01/28/20 165 lb (74.8 kg)  01/11/20 170 lb 6.4 oz (77.3 kg)      GEN: Well nourished, well developed, in no acute distress. HEENT: normal. Neck: Supple, no JVD, carotid bruits, or masses. Cardiac: RRR, no murmurs, rubs, or gallops. No clubbing, cyanosis, edema.  Radials/PT 2+ and equal bilaterally.  Respiratory:  Respirations regular and unlabored, clear to auscultation bilaterally. GI: Soft, nontender, nondistended. MS: No deformity or atrophy. Skin: Warm and dry, no rash. Neuro:  Strength and sensation are intact. Psych: Normal affect.  Assessment & Plan    CAD -  he had DES placed to mid LAD August 2013 during presentation for NSTEMI in the setting of DKA.  Extra stress test December 2017 without ischemia. Stable with no anginal symptoms. No indication for ischemic evaluation.  GDMT includes aspirin, beta blocker, statin. Heart healthy diet and regular cardiovascular exercise encouraged.    HLD - 02/20/21 HDL 61, LDL 74, triglycerides 52, total cholesterol 146. Continue Crestor '10mg'$  QD. Discussed that LDL goal not at goal of <70 but endorses dietary indiscretion since being back in the [place]. Plans to follow more healthy diet and increase exercise.  HTN - BP well controlled. Continue current antihypertensive regimen.  Refills provided.   DM2 - 03/04/21 A1c 9.7. Continue to follow with PCP.   Medication management - Provided patient 30 days supply with 11 refills for 1 year supply. Note to pharmacy included that okay to fill for 10 months supply so he has medication while out of [place]te country.  Disposition: Follow up in 1 year(s) with Dr. Angelena Form or APP.  Signed, Loel Dubonnet, NP 04/04/2021, 8:35 AM [place]

## 2021-04-04 NOTE — Patient Instructions (Signed)
Medication Instructions:  Continue your current medications.   *If you need a refill on your cardiac medications before your next appointment, please call your pharmacy*   Lab Work: None ordered today.   Testing/Procedures: Your EKG today shows normal sinus rhythm which is a great result.   Follow-Up: At [facility], you and your health needs are our priority.  As part of our continuing mission to provide you with exceptional heart care, we have created designated Provider Care Teams.  These Care Teams include your primary Cardiologist (physician) and Advanced Practice Providers (APPs -  Physician Assistants and Nurse Practitioners) who all work together to provide you with the care you need, when you need it.  We recommend signing up for the patient portal called "MyChart".  Sign up information is provided on this After Visit Summary.  MyChart is used to connect with patients for Virtual Visits (Telemedicine).  Patients are able to view lab/test results, encounter notes, upcoming appointments, etc.  Non-urgent messages can be sent to your provider as well.   To learn more about what you can do with MyChart, go to [email or web address].    Your next appointment:   1 year(s)  The format for your next appointment:   In Person  Provider:   You may see Lauree Chandler, MD or one of the following Advanced Practice Providers on your designated Care Team:   Melina Copa, PA-C Ermalinda Barrios, PA-C   Other Instructions  Recommend purchasing an automatic arm blood pressure cuff. Omron is a good brand they are about $35-40 at the pharmacy or online.   Heart Healthy Diet Recommendations: A low-salt diet is recommended. Meats should be grilled, baked, or boiled. Avoid fried foods. Focus on lean protein sources like fish or chicken with vegetables and fruits. The American Heart Association is a Microbiologist!    Exercise recommendations: The American Heart Association recommends 150  minutes of moderate intensity exercise weekly. Try 30 minutes of moderate intensity exercise 4-5 times per week. This could include walking, jogging, or swimming.

## 2021-04-04 NOTE — Telephone Encounter (Signed)
Patients wife calling to request a 10 month refill for patients Doxy. 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 I informed her that it looks as though we sent a script for this last month with 3 refills. She needs the remaining refills sent so patient has enough medication for his 10 month business trip.

## 2021-04-04 NOTE — Telephone Encounter (Signed)
Ok  per [employer] [place] refill sent

## 2021-04-08 NOTE — Telephone Encounter (Signed)
Pt states that all medications were filled except for the one below:   *STAT* If patient is at the pharmacy, call can be transferred to refill team.   1. Which medications need to be refilled? (please list name of each medication and dose if known) rosuvastatin (CRESTOR) 10 MG tablet  2. Which pharmacy/location (including street and city if local pharmacy) is medication to be sent to?[id number] [place]. Do they need a 30 day or 90 day supply? 10 month supply

## 2021-04-09 ENCOUNTER — Other Ambulatory Visit (HOSPITAL_BASED_OUTPATIENT_CLINIC_OR_DEPARTMENT_OTHER): Payer: Self-pay | Admitting: Family

## 2021-04-09 DIAGNOSIS — E785 Hyperlipidemia, unspecified: Secondary | ICD-10-CM

## 2021-04-09 DIAGNOSIS — I25118 Atherosclerotic heart disease of native coronary artery with other forms of angina pectoris: Secondary | ICD-10-CM

## 2021-04-09 NOTE — Telephone Encounter (Signed)
Rx(s) sent to pharmacy electronically.  

## 2021-12-25 ENCOUNTER — Ambulatory Visit (INDEPENDENT_AMBULATORY_CARE_PROVIDER_SITE_OTHER): Payer: Managed Care, Other (non HMO) | Admitting: Podiatry

## 2021-12-25 ENCOUNTER — Encounter: Payer: Self-pay | Admitting: Podiatry

## 2021-12-25 ENCOUNTER — Ambulatory Visit (INDEPENDENT_AMBULATORY_CARE_PROVIDER_SITE_OTHER): Payer: Managed Care, Other (non HMO)

## 2021-12-25 DIAGNOSIS — S92354A Nondisplaced fracture of fifth metatarsal bone, right foot, initial encounter for closed fracture: Secondary | ICD-10-CM

## 2021-12-25 DIAGNOSIS — S99921A Unspecified injury of right foot, initial encounter: Secondary | ICD-10-CM

## 2021-12-25 NOTE — Progress Notes (Signed)
Subjective:   Patient ID: Jordan Waller, male   DOB: 66 y.o.   MRN: [id number]   HPI Patient states he has had a fracture of the base of his fifth metatarsal right and was out [place] the country and has been nonweightbearing for 10 weeks and wants to decide what to do and really would like to be able to bear weight again.  States its been swollen and sore and he gets numbness in his forefoot and admits he probably should have walked earlier but did not   ROS      Objective:  Physical Exam  Neurovascular status intact with inflammation discomfort of the forefoot right and especially around the fifth metatarsal base right with swelling of the area localized in nature.  Patient is found to have good digital perfusion well oriented x3     Assessment:  Fracture of the base of the fifth metatarsal right with prolonged period of nonweightbearing which is probably created some issues as far as neurological vascular structure with this long of a period of nonweightbearing     Plan:  H&P x-rays reviewed dispensed surgical shoe applied Unna boot to try to help with some of the swelling advised on possible physical therapy or other treatment to begin hot cold therapy along with range of motion stretch.  Reappoint 4 weeks or earlier if needed  X-rays indicate there is a fracture of the base of the fifth metatarsal right appears to be healing and is not Jones fracture but more avulsion no indications fracture around the ankle joint

## 2022-01-30 ENCOUNTER — Ambulatory Visit (INDEPENDENT_AMBULATORY_CARE_PROVIDER_SITE_OTHER): Payer: Managed Care, Other (non HMO)

## 2022-01-30 ENCOUNTER — Encounter: Payer: Self-pay | Admitting: Podiatry

## 2022-01-30 ENCOUNTER — Ambulatory Visit: Payer: Managed Care, Other (non HMO) | Admitting: Podiatry

## 2022-01-30 DIAGNOSIS — S92354A Nondisplaced fracture of fifth metatarsal bone, right foot, initial encounter for closed fracture: Secondary | ICD-10-CM

## 2022-01-30 NOTE — Progress Notes (Signed)
Subjective:   Patient ID: Jordan Waller, male   DOB: 66 y.o.   MRN: [id number]   HPI Patient states he has very little pain but he still has swelling in his foot and into his ankle right that seems to be improved but still is present   ROS      Objective:  Physical Exam  Neurovascular status intact with swelling still in the ankle right and into the foot negative Bevelyn Buckles' sign noted currently with minimal discomfort noted lateral side right foot where the fracture occurred.  Patient is trying elevation currently but is not using compression     Assessment:  Overall I think healing well from the fracture but does have swelling mechanism which seems to be through the related to the disuse of the fact he still wearing surgical shoe it is not utilizing compression     Plan:  H&P x-rays reviewed and went ahead today advised on the importance of compression and gradual increase in usage of foot and I did dispense ankle compression stocking.  Reappoint to recheck if symptoms persist I did tell him if he should start develop any swelling in his lower leg or any shortness of breath he is to go straight to the emergency room but no indications currently at this time  X-rays indicate fracture of the base of the fifth metatarsal right appears to be fully healed at this current time even though there is probably still some mechanism occurring with the healing process

## 2022-02-10 ENCOUNTER — Ambulatory Visit: Payer: Managed Care, Other (non HMO) | Admitting: Podiatry

## 2022-02-10 ENCOUNTER — Ambulatory Visit (INDEPENDENT_AMBULATORY_CARE_PROVIDER_SITE_OTHER): Payer: Managed Care, Other (non HMO)

## 2022-02-10 DIAGNOSIS — S92354A Nondisplaced fracture of fifth metatarsal bone, right foot, initial encounter for closed fracture: Secondary | ICD-10-CM | POA: Diagnosis not present

## 2022-02-10 NOTE — Progress Notes (Signed)
Subjective:   Patient ID: Jordan Waller, male   DOB: 66 y.o.   MRN: [id number]   HPI Patient presents with a right foot feeling better but still has some tingling and burning and he wanted to make sure was healing okay   ROS      Objective:  Physical Exam  Neurovascular status intact with right lateral foot still showing some swelling discomfort and he is concerned he may have done something to it wants to make sure its not injured     Assessment:  Healing slowly from fifth metatarsal base fracture right      Plan:  X-rays taken today confirm continued healing I explained this to him and should heal uneventfully but may take another several months to heal completely normal to have some numbness  X-rays indicate that the bone is healing well did not see signs of pathology

## 2022-03-30 ENCOUNTER — Other Ambulatory Visit (HOSPITAL_BASED_OUTPATIENT_CLINIC_OR_DEPARTMENT_OTHER): Payer: Self-pay | Admitting: Family

## 2022-03-30 DIAGNOSIS — E785 Hyperlipidemia, unspecified: Secondary | ICD-10-CM

## 2022-03-30 DIAGNOSIS — I25118 Atherosclerotic heart disease of native coronary artery with other forms of angina pectoris: Secondary | ICD-10-CM

## 2022-03-31 NOTE — Telephone Encounter (Signed)
Rx(s) sent to pharmacy electronically.  

## 2022-04-25 ENCOUNTER — Encounter: Payer: Self-pay | Admitting: Cardiovascular Disease

## 2022-04-25 ENCOUNTER — Ambulatory Visit: Payer: Managed Care, Other (non HMO) | Attending: Cardiovascular Disease | Admitting: Cardiovascular Disease

## 2022-04-25 ENCOUNTER — Encounter: Payer: Self-pay | Admitting: *Deleted

## 2022-04-25 VITALS — BP 116/66 | HR 78 | Ht 67.0 in | Wt 159.0 lb

## 2022-04-25 DIAGNOSIS — E785 Hyperlipidemia, unspecified: Secondary | ICD-10-CM | POA: Diagnosis not present

## 2022-04-25 DIAGNOSIS — I25118 Atherosclerotic heart disease of native coronary artery with other forms of angina pectoris: Secondary | ICD-10-CM | POA: Diagnosis not present

## 2022-04-25 DIAGNOSIS — R5383 Other fatigue: Secondary | ICD-10-CM

## 2022-04-25 MED ORDER — ROSUVASTATIN CALCIUM 10 MG PO TABS: 10.0000 mg | ORAL_TABLET | Freq: Every day | ORAL | 11 refills | Status: DC

## 2022-04-25 MED ORDER — CLOPIDOGREL BISULFATE 75 MG PO TABS: 75.0000 mg | ORAL_TABLET | Freq: Every day | ORAL | 11 refills | Status: DC

## 2022-04-25 NOTE — Progress Notes (Signed)
Chief Complaint  Patient presents with   Follow-up    CAD   History of Present Illness: 66 yo male with history of CAD, DM, HLD and former tobacco abuse who is here today for cardiac follow up. He presented to [facility] August 2013 with weakness, nausea, vomiting, generalized pain, and elevated blood sugars starting 02/29/12. He was found to be in DKA. Cardiac cath on 03/04/12 showed a 99% mid LAD stenosis. This was treated with a 3.0 x 16 mm Promus Element DES. Normal exercise stress test in December 2017. Echo July 2021 with LVEF=60-65%. No significant valve disease.    He is here today for follow up. The patient denies any chest pain, dyspnea, palpitations, lower extremity edema, orthopnea, PND, dizziness, near syncope or syncope.   Primary Care Physician: Lawerance Cruel, MD  Past Medical History:  Diagnosis Date   CAD (coronary artery disease)    NSTEMI August 2013, DES mid LAD   Chronic folliculitis    Diabetes mellitus 2012   on insulin pump throughout   High cholesterol    Hypertension     Past Surgical History:  Procedure Laterality Date   LEFT HEART CATHETERIZATION WITH CORONARY ANGIOGRAM N/A 03/04/2012   Procedure: LEFT HEART CATHETERIZATION WITH CORONARY ANGIOGRAM;  Surgeon: Burnell Blanks, MD;  Location: [facility] CATH LAB;  Service: Cardiovascular;  Laterality: N/A;   ROTATOR CUFF REPAIR     TONSILLECTOMY AND ADENOIDECTOMY      Current Outpatient Medications  Medication Sig Dispense Refill   aspirin EC 81 MG tablet Take 1 tablet (81 mg total) by mouth daily. 30 tablet 11   b complex vitamins capsule Take 1 capsule by mouth daily.     BD PEN NEEDLE NANO U/F 32G X 4 MM MISC as directed.     doxycycline (VIBRA-TABS) 100 MG tablet Take 1 tablet (100 mg total) by mouth 2 (two) times daily. 600 tablet 0   insulin aspart (NOVOLOG) 100 UNIT/ML injection Inject 4-13 Units into the skin 3 (three) times daily with meals. Sliding scale: If blood glucose is 70 to  130: take 4 units at beginning of meal.  CBG 131 to 160: 5 units.   161 to 190: 6 units.   191 to 220: 7 units.   221 to 250: 8 units.   251 to 280:  9 units.   281 to 310: then 10 units.   311 to 340: 11 units.   341 to 370: 12 units.   371 to 400: 13 units.   Over 400 mg/dL: 13 units and notify your MD 5 pen 3   insulin glargine (LANTUS) 100 UNIT/ML injection Inject 15 Units into the skin as needed. Backup for Insulin Pump     Insulin Pen Needle 32G X 4 MM MISC by Does not apply route.     lisinopril (ZESTRIL) 2.5 MG tablet Take 1 tablet (2.5 mg total) by mouth daily. 30 tablet 12   metoprolol tartrate (LOPRESSOR) 25 MG tablet Take 1 tablet (25 mg total) by mouth 2 (two) times daily. 60 tablet 12   mupirocin ointment (BACTROBAN) 2 % Apply 1 application topically daily. 22 g 3   nitroGLYCERIN (NITROSTAT) 0.4 MG SL tablet Place 1 tablet (0.4 mg total) under the tongue every 5 (five) minutes as needed for chest pain. Up to 3 times then call 911 30 tablet 12   NOVOLOG 100 UNIT/ML injection 1 Units continuous. INSULIN PUMP I unit every hour on the hour Test  five times a day B/s over 100 get an acute unit of one unit per hundred  5   clopidogrel (PLAVIX) 75 MG tablet Take 1 tablet (75 mg total) by mouth daily. 30 tablet 11   rosuvastatin (CRESTOR) 10 MG tablet Take 1 tablet (10 mg total) by mouth daily. 30 tablet 11   No current facility-administered medications for this visit.    No Known Allergies  Social History   Socioeconomic History   Marital status: Married    Spouse name: Not on file   Number of children: 3   Years of education: Not on file   Highest education level: Not on file  Occupational History   Occupation: corp vice president    Employer: [employer]  Tobacco Use   Smoking status: Former    Packs/day: 0.50    Years: 20.00    Total pack years: 10.00    Types: Cigarettes    Quit date: 2012    Years since quitting: 11.7   Smokeless tobacco: Never  Substance and  Sexual Activity   Alcohol use: Yes    Alcohol/week: 1.0 standard drink of alcohol    Types: 1 Standard drinks or equivalent per week    Comment: 2-4 drinks 3 times a week   Drug use: No   Sexual activity: Not on file  Other Topics Concern   Not on file  Social History Narrative   Patient works for a company based on [place] and travels there for 4 months at a time, returning only for about 6 weeks.  He returned home in June (2020) and has been unable to return thus far due to the COVID-19 pandemic.   Social Determinants of Health   Financial Resource Strain: Not on file  Food Insecurity: Not on file  Transportation Needs: Not on file  Physical Activity: Not on file  Stress: Not on file  Social Connections: Not on file  Intimate Partner Violence: Not on file    Family History  Problem Relation Age of Onset   Colon cancer Mother    Diabetes type II Mother    Colon cancer Sister    Coronary artery disease Neg Hx        No first degree relatives with CAD    Review of Systems:  As stated in the HPI and otherwise negative.   BP 116/66   Pulse 78   Ht '5\' 7"'$  (1.702 m)   Wt 159 lb (72.1 kg)   SpO2 98%   BMI 24.90 kg/m   Physical Examination: General: Well developed, well nourished, NAD  HEENT: OP clear, mucus membranes moist  SKIN: warm, dry. No rashes. Neuro: No focal deficits  Musculoskeletal: Muscle strength 5/5 all ext  Psychiatric: Mood and affect normal  Neck: No JVD, no carotid bruits, no thyromegaly, no lymphadenopathy.  Lungs:Clear bilaterally, no wheezes, rhonci, crackles Cardiovascular: Regular rate and rhythm. No murmurs, gallops or rubs. Abdomen:Soft. Bowel sounds present. Non-tender.  Extremities: No lower extremity edema. Pulses are 2 + in the bilateral DP/PT.  Cardiac cath 03/04/12:  Left main: No disease.  Left Anterior Descending Artery: Large caliber vessel that courses to the apex. The ostium has 25% stenosis. The proximal vessel has luminal  irregularities. The mid vessel has a 99% stenosis. The distal vessel has no obstructive disease. There is a moderate sized diagonal branch with no disease noted.  Circumflex Artery: Moderate sized vessel with moderate sized first obtuse marginal branch. The AV groove Circumflex is moderate sized beyond  the OM branch. No disease noted.  Right Coronary Artery: Large, dominant vessel with mild luminal irregularities in the proximal/mid and distal vessel. The PDA has 20% ostial stenosis.  Left Ventricular Angiogram: LVEF 40-45% with hypokinesis of the anterior wall and apex.  Impression:  1. Single vessel CAD, now s/p successful PTCA/DES x 1 mid LAD  2. Segmental LV systolic dysfunction  3. NSTEMI   Echo 02/01/20:  1. Left ventricular ejection fraction, by estimation, is 60 to 65%. The  left ventricle has normal function. The left ventricle has no regional  wall motion abnormalities. Left ventricular diastolic parameters were  normal. The average left ventricular  global longitudinal strain is -23.9 %. The global longitudinal strain is  normal.   2. Right ventricular systolic function is normal. The right ventricular  size is mildly enlarged.   3. The mitral valve is myxomatous. No evidence of mitral valve  regurgitation. No evidence of mitral stenosis.   4. Restricted non coronary cusp. The aortic valve is tricuspid. Aortic  valve regurgitation is not visualized. Mild aortic valve sclerosis is  present, with no evidence of aortic valve stenosis.   5. The inferior vena cava is normal in size with greater than 50%  respiratory variability, suggesting right atrial pressure of 3 mmHg.   EKG:  EKG is ordered today. The ekg ordered today demonstrates   Recent Labs: No results found for requested labs within last 365 days.   Lipid Panel Followed in primary care.    Wt Readings from Last 3 Encounters:  04/25/22 159 lb (72.1 kg)  04/04/21 166 lb (75.3 kg)  01/28/20 165 lb (74.8 kg)      Assessment and Plan:   1. CAD without angina: He had a drug eluting stent placed in his mid LAD in August 2013 during presentation for NSTEMI in setting of DKA.  Exercise stress test December 2017 without ischemia. He has no chest pain but he does have fatigue. Will arrange an echo to assess LVEF and an exercise nuclear stress test to exclude ischemia. Will continue ASA, Plavix, statin and beta blocker.     2. Hyperlipidemia: Lipids followed in primary care. Continue statin   Labs/ tests ordered today include:   Orders Placed This Encounter  Procedures   MYOCARDIAL PERFUSION IMAGING   EKG 12-Lead   ECHOCARDIOGRAM COMPLETE    Disposition:   F/U with me in 12  months  Signed, Lauree Chandler, MD 04/25/2022 2:52 PM    [facility] [address], [place], [place]  [address] Phone: [phone]; Fax: [phone]

## 2022-04-25 NOTE — Patient Instructions (Signed)
Medication Instructions:  No changes *If you need a refill on your cardiac medications before your next appointment, please call your pharmacy*   Lab Work: none If you have labs (blood work) drawn today and your tests are completely normal, you will receive your results only by: [place] (if you have MyChart) OR A paper copy in the mail If you have any lab test that is abnormal or we need to change your treatment, we will call you to review the results.   Testing/Procedures: Your physician has requested that you have an echocardiogram. Echocardiography is a painless test that uses sound waves to create images of your heart. It provides your doctor with information about the size and shape of your heart and how well your heart's chambers and valves are working. This procedure takes approximately one hour. There are no restrictions for this procedure.  Your physician has requested that you have en exercise stress myoview. For further information please visit [email or web address]. Please follow instruction sheet, as given.   Follow-Up: At [facility], you and your health needs are our priority.  As part of our continuing mission to provide you with exceptional heart care, we have created designated Provider Care Teams.  These Care Teams include your primary Cardiologist (physician) and Advanced Practice Providers (APPs -  Physician Assistants and Nurse Practitioners) who all work together to provide you with the care you need, when you need it.   Your next appointment:   12 month(s)  The format for your next appointment:   In Person  Provider:   Lauree Chandler, MD      Important Information About Sugar

## 2022-05-08 NOTE — Addendum Note (Signed)
Addended by: Rodman Key on: 05/08/2022 10:01 AM   Modules accepted: Orders

## 2022-05-08 NOTE — Addendum Note (Signed)
Addended by: Lauree Chandler D on: 05/08/2022 10:26 AM   Modules accepted: Orders

## 2022-05-12 ENCOUNTER — Telehealth (HOSPITAL_COMMUNITY): Payer: Self-pay

## 2022-05-12 NOTE — Telephone Encounter (Signed)
Spoke with the patient, detailed instructions left with the patient. Asked to call back with any questions. He stated that he would be here for his test. Asked to call back with any questions. S.Dezmin Kittelson EMTP

## 2022-05-13 ENCOUNTER — Ambulatory Visit (HOSPITAL_COMMUNITY): Payer: Managed Care, Other (non HMO) | Attending: Cardiology

## 2022-05-13 ENCOUNTER — Ambulatory Visit (HOSPITAL_BASED_OUTPATIENT_CLINIC_OR_DEPARTMENT_OTHER): Payer: Managed Care, Other (non HMO)

## 2022-05-13 DIAGNOSIS — I25118 Atherosclerotic heart disease of native coronary artery with other forms of angina pectoris: Secondary | ICD-10-CM | POA: Insufficient documentation

## 2022-05-13 DIAGNOSIS — R5383 Other fatigue: Secondary | ICD-10-CM

## 2022-05-13 LAB — MYOCARDIAL PERFUSION IMAGING
Angina Index: 0
Duke Treadmill Score: 9
Estimated workload: 10.4
Exercise duration (min): 9 min
Exercise duration (sec): 13 s
LV dias vol: 55 mL (ref 62–150)
LV sys vol: 18 mL
MPHR: 152 {beats}/min
Nuc Stress EF: 67 %
Peak HR: 144 {beats}/min
Percent HR: 94 %
Rest HR: 60 {beats}/min
Rest Nuclear Isotope Dose: 10.4 mCi
SDS: 0
SRS: 0
SSS: 0
ST Depression (mm): 0 mm
Stress Nuclear Isotope Dose: 31.6 mCi
TID: 0.8

## 2022-05-13 LAB — ECHOCARDIOGRAM COMPLETE
Area-P 1/2: 2.08 cm2
Height: 67 in
S' Lateral: 2.9 cm
Weight: 2544 oz

## 2022-05-13 MED ORDER — TECHNETIUM TC 99M TETROFOSMIN IV KIT
10.4000 | PACK | Freq: Once | INTRAVENOUS | Status: AC | PRN
  Administered 2022-05-13: 10.4 via INTRAVENOUS

## 2022-05-13 MED ORDER — TECHNETIUM TC 99M TETROFOSMIN IV KIT
31.6000 | PACK | Freq: Once | INTRAVENOUS | Status: AC | PRN
  Administered 2022-05-13: 31.6 via INTRAVENOUS

## 2022-12-29 ENCOUNTER — Other Ambulatory Visit (HOSPITAL_BASED_OUTPATIENT_CLINIC_OR_DEPARTMENT_OTHER): Payer: Self-pay | Admitting: Family

## 2022-12-29 DIAGNOSIS — I25118 Atherosclerotic heart disease of native coronary artery with other forms of angina pectoris: Secondary | ICD-10-CM

## 2022-12-29 NOTE — Telephone Encounter (Signed)
Patient of Dr. McAlhany. Please review for refill. Thank you!  

## 2023-03-25 ENCOUNTER — Other Ambulatory Visit: Payer: Self-pay | Admitting: Cardiovascular Disease

## 2023-03-25 DIAGNOSIS — I25118 Atherosclerotic heart disease of native coronary artery with other forms of angina pectoris: Secondary | ICD-10-CM

## 2023-03-25 DIAGNOSIS — E785 Hyperlipidemia, unspecified: Secondary | ICD-10-CM

## 2023-06-10 ENCOUNTER — Other Ambulatory Visit: Payer: Self-pay | Admitting: Cardiovascular Disease

## 2023-06-10 DIAGNOSIS — E785 Hyperlipidemia, unspecified: Secondary | ICD-10-CM

## 2023-06-10 DIAGNOSIS — I25118 Atherosclerotic heart disease of native coronary artery with other forms of angina pectoris: Secondary | ICD-10-CM

## 2023-07-07 ENCOUNTER — Other Ambulatory Visit: Payer: Self-pay | Admitting: Cardiovascular Disease

## 2023-07-07 DIAGNOSIS — I25118 Atherosclerotic heart disease of native coronary artery with other forms of angina pectoris: Secondary | ICD-10-CM

## 2023-07-07 DIAGNOSIS — E785 Hyperlipidemia, unspecified: Secondary | ICD-10-CM

## 2023-07-10 ENCOUNTER — Ambulatory Visit: Payer: Managed Care, Other (non HMO) | Attending: Cardiovascular Disease | Admitting: Cardiovascular Disease

## 2023-07-10 ENCOUNTER — Encounter: Payer: Self-pay | Admitting: Cardiovascular Disease

## 2023-07-10 VITALS — BP 110/70 | HR 57 | Ht 67.0 in | Wt 164.4 lb

## 2023-07-10 DIAGNOSIS — E785 Hyperlipidemia, unspecified: Secondary | ICD-10-CM | POA: Diagnosis not present

## 2023-07-10 DIAGNOSIS — I25118 Atherosclerotic heart disease of native coronary artery with other forms of angina pectoris: Secondary | ICD-10-CM | POA: Diagnosis not present

## 2023-07-10 NOTE — Progress Notes (Signed)
Chief Complaint  Patient presents with   Follow-up    CAD   History of Present Illness: 67 yo male with history of CAD, DM, HLD and former tobacco abuse who is here today for cardiac follow up. He presented to [facility] August 2013 with weakness, nausea, vomiting, generalized pain, and elevated blood sugars starting 02/29/12. He was found to be in DKA. Cardiac cath on 03/04/12 showed a 99% mid LAD stenosis. This was treated with a 3.0 x 16 mm Promus Element DES. He was seen in our office in October 2023 and reported fatigue. Echo October 2023 with LVEF=55%, no significant valve disease. Stress myoview October 2023 with no ischemia.    He is here today for follow up. The patient denies any chest pain, dyspnea, palpitations, lower extremity edema, orthopnea, PND, dizziness, near syncope or syncope.   Primary Care Physician: Daisy Floro, MD  Past Medical History:  Diagnosis Date   CAD (coronary artery disease)    NSTEMI August 2013, DES mid LAD   Chronic folliculitis    Diabetes mellitus 2012   on insulin pump throughout   High cholesterol    Hypertension     Past Surgical History:  Procedure Laterality Date   LEFT HEART CATHETERIZATION WITH CORONARY ANGIOGRAM N/A 03/04/2012   Procedure: LEFT HEART CATHETERIZATION WITH CORONARY ANGIOGRAM;  Surgeon: Kathleene Hazel, MD;  Location: [facility] CATH LAB;  Service: Cardiovascular;  Laterality: N/A;   ROTATOR CUFF REPAIR     TONSILLECTOMY AND ADENOIDECTOMY      Current Outpatient Medications  Medication Sig Dispense Refill   aspirin EC 81 MG tablet Take 1 tablet (81 mg total) by mouth daily. 30 tablet 11   b complex vitamins capsule Take 1 capsule by mouth daily.     BD PEN NEEDLE NANO U/F 32G X 4 MM MISC as directed.     clopidogrel (PLAVIX) 75 MG tablet TAKE 1 TABLET BY MOUTH EVERY DAY 15 tablet 0   doxycycline (VIBRA-TABS) 100 MG tablet Take 1 tablet (100 mg total) by mouth 2 (two) times daily. 600 tablet 0   insulin  aspart (NOVOLOG) 100 UNIT/ML injection Inject 4-13 Units into the skin 3 (three) times daily with meals. Sliding scale: If blood glucose is 70 to 130: take 4 units at beginning of meal.  CBG 131 to 160: 5 units.   161 to 190: 6 units.   191 to 220: 7 units.   221 to 250: 8 units.   251 to 280:  9 units.   281 to 310: then 10 units.   311 to 340: 11 units.   341 to 370: 12 units.   371 to 400: 13 units.   Over 400 mg/dL: 13 units and notify your MD 5 pen 3   insulin glargine (LANTUS) 100 UNIT/ML injection Inject 15 Units into the skin as needed. Backup for Insulin Pump     Insulin Pen Needle 32G X 4 MM MISC by Does not apply route.     lisinopril (ZESTRIL) 2.5 MG tablet TAKE 1 TABLET BY MOUTH EVERY DAY 360 tablet 1   metoprolol tartrate (LOPRESSOR) 25 MG tablet Take 1 tablet (25 mg total) by mouth 2 (two) times daily. 60 tablet 12   mupirocin ointment (BACTROBAN) 2 % Apply 1 application topically daily. 22 g 3   nitroGLYCERIN (NITROSTAT) 0.4 MG SL tablet Place 1 tablet (0.4 mg total) under the tongue every 5 (five) minutes as needed for chest pain. Up to  3 times then call 911 30 tablet 12   NOVOLOG 100 UNIT/ML injection 1 Units continuous. INSULIN PUMP I unit every hour on the hour Test five times a day B/s over 100 get an acute unit of one unit per hundred  5   rosuvastatin (CRESTOR) 10 MG tablet TAKE 1 TABLET BY MOUTH EVERY DAY 15 tablet 0   No current facility-administered medications for this visit.    No Known Allergies  Social History   Socioeconomic History   Marital status: Married    Spouse name: Not on file   Number of children: 3   Years of education: Not on file   Highest education level: Not on file  Occupational History   Occupation: corp vice president    Employer: [employer]  Tobacco Use   Smoking status: Former    Current packs/day: 0.00    Average packs/day: 0.5 packs/day for 20.0 years (10.0 ttl pk-yrs)    Types: Cigarettes    Start date: 73    Quit date:  2012    Years since quitting: 12.9   Smokeless tobacco: Never  Substance and Sexual Activity   Alcohol use: Yes    Alcohol/week: 1.0 standard drink of alcohol    Types: 1 Standard drinks or equivalent per week    Comment: 2-4 drinks 3 times a week   Drug use: No   Sexual activity: Not on file  Other Topics Concern   Not on file  Social History Narrative   Patient works for a company based on [place] and travels there for 4 months at a time, returning only for about 6 weeks.  He returned home in June (2020) and has been unable to return thus far due to the COVID-19 pandemic.   Social Drivers of Corporate investment banker Strain: Not on file  Food Insecurity: Not on file  Transportation Needs: Not on file  Physical Activity: Not on file  Stress: Not on file  Social Connections: Not on file  Intimate Partner Violence: Not on file    Family History  Problem Relation Age of Onset   Colon cancer Mother    Diabetes type II Mother    Colon cancer Sister    Coronary artery disease Neg Hx        No first degree relatives with CAD    Review of Systems:  As stated in the HPI and otherwise negative.   BP 110/70 (BP Location: Left Arm, Patient Position: Sitting)   Pulse (!) 57   Ht 5\' 7"  (1.702 m)   Wt 74.6 kg   SpO2 99%   BMI 25.75 kg/m   Physical Examination: General: Well developed, well nourished, NAD  HEENT: OP clear, mucus membranes moist  SKIN: warm, dry. No rashes. Neuro: No focal deficits  Musculoskeletal: Muscle strength 5/5 all ext  Psychiatric: Mood and affect normal  Neck: No JVD, no carotid bruits, no thyromegaly, no lymphadenopathy.  Lungs:Clear bilaterally, no wheezes, rhonci, crackles Cardiovascular: Regular rate and rhythm. No murmurs, gallops or rubs. Abdomen:Soft. Bowel sounds present. Non-tender.  Extremities: No lower extremity edema. Pulses are 2 + in the bilateral DP/PT.  EKG:  EKG is ordered today. The ekg ordered today demonstrates  EKG  Interpretation Date/Time:  Friday July 10 2023 15:34:51 EST Ventricular Rate:  56 PR Interval:  146 QRS Duration:  90 QT Interval:  420 QTC Calculation: 405 R Axis:   63  Text Interpretation: Sinus bradycardia Diffuse  ST elevation,unchanged. Non-specific T wave  abn, unchanged Confirmed by Verne Carrow [phone]) on 07/10/2023 3:45:10 PM   Recent Labs: No results found for requested labs within last 365 days.   Lipid Panel Followed in primary care.    Wt Readings from Last 3 Encounters:  07/10/23 74.6 kg  05/13/22 72.1 kg  04/25/22 72.1 kg     Assessment and Plan:   1. CAD without angina: He had a drug eluting stent placed in his mid LAD in August 2013 during presentation for NSTEMI in setting of DKA.  Stress myoview October 2023 with no ischemia. Echo October 2023 with normal LV function. No chest pain. Continue ASA, Plavix, beta blocker and statin.     2. Hyperlipidemia: Lipids followed in endocrinology. Goal LDL under 70. Continue statin.    3. Diabetes: Followed in endocrinology clinic.   Labs/ tests ordered today include:   Orders Placed This Encounter  Procedures   EKG 12-Lead    Disposition:   F/U with me in 12  months  Signed, Verne Carrow, MD 07/10/2023 4:26 PM    [facility] [address] [place], [place], [place]  [address] Phone: [phone]; Fax: [phone]

## 2023-07-10 NOTE — Patient Instructions (Signed)
Medication Instructions:  No changes *If you need a refill on your cardiac medications before your next appointment, please call your pharmacy*   Lab Work: none If you have labs (blood work) drawn today and your tests are completely normal, you will receive your results only by: [place] (if you have MyChart) OR A paper copy in the mail If you have any lab test that is abnormal or we need to change your treatment, we will call you to review the results.   Testing/Procedures: none   Follow-Up: At [facility], you and your health needs are our priority.  As part of our continuing mission to provide you with exceptional heart care, we have created designated Provider Care Teams.  These Care Teams include your primary Cardiologist (physician) and Advanced Practice Providers (APPs -  Physician Assistants and Nurse Practitioners) who all work together to provide you with the care you need, when you need it.   Your next appointment:   12 month(s)  Provider:   Lauree Chandler, MD     Other Instructions

## 2023-07-17 ENCOUNTER — Other Ambulatory Visit: Payer: Self-pay | Admitting: Cardiovascular Disease

## 2023-07-17 DIAGNOSIS — E785 Hyperlipidemia, unspecified: Secondary | ICD-10-CM

## 2023-07-17 DIAGNOSIS — I25118 Atherosclerotic heart disease of native coronary artery with other forms of angina pectoris: Secondary | ICD-10-CM

## 2023-08-25 ENCOUNTER — Other Ambulatory Visit: Payer: Self-pay

## 2023-08-25 DIAGNOSIS — I25118 Atherosclerotic heart disease of native coronary artery with other forms of angina pectoris: Secondary | ICD-10-CM

## 2023-08-25 MED ORDER — METOPROLOL TARTRATE 25 MG PO TABS: 25.0000 mg | ORAL_TABLET | Freq: Two times a day (BID) | ORAL | 10 refills | Status: DC

## 2023-10-09 ENCOUNTER — Encounter: Payer: Managed Care, Other (non HMO) | Admitting: Dietician

## 2023-10-12 ENCOUNTER — Encounter: Attending: Internal Medicine | Admitting: Dietician

## 2023-10-12 ENCOUNTER — Encounter: Payer: Self-pay | Admitting: Dietician

## 2023-10-12 DIAGNOSIS — E1069 Type 1 diabetes mellitus with other specified complication: Secondary | ICD-10-CM | POA: Diagnosis present

## 2023-10-12 NOTE — Progress Notes (Signed)
 Diabetes Self-Management Education  Visit Type: First/Initial  Appt. Start Time: 1020 Appt. End Time: 1120  10/12/2023  Mr. Jordan Waller, identified by name and date of birth, is a 68 y.o. male with a diagnosis of Diabetes: Type 1.   ASSESSMENT  He would now like more structure in his diet.  He does not want to get hypoglycemia and states that he would rather be high. His goal is to have a blood glucose average of 250.  Then work to get it to 200. Went to the [facility] portal but was unable to see patient's current data.  All data was from 1 year ago. Adjusted patient settings in his Dexcom to increase the hypoglycemia alert to 70 and increase the hyperglycemia alert to promote better sleep. Instructed patient to contact tech support for the Omnipod 5 or Dexcom to problem solve to get current data from the dexcom and make sure his Auto mode is correct.  Changed his pump into auto mode as it was running in manual mode. He is not counting carbs or entering carbs for a bolus.  When he does bolus he is just bolusing for a correction factor after a meal "chasing the highs".  Pump settings noted: Basal rate 0.5 Target 110, correct above 140 Minimum blood glucose for a calculation 70 ICR 1:6 ISF:  35 Duration of Insulin Action 2 change to 3  History includes:  Type 1 Diabetes (2013), MI (2013), stent, HLD, very strong family history of colon cancer Labs noted:  05/27/2023 A1C 10.1%, eGFR 93, Chol180, Triglycerides 101, HLD 57, LDL 104 Medications include:  Novolog via pump Pump:  Omnipod 5 CGM:  Dexcom G6  66" 166 lbs 08/2023  Estimated needs: per day 2400-2500 calories 90-100 grams protein   Patient lives with his wife.  She does the shopping and cooking.  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  He plans on playing golf, fish, read, visit children after retirement. Increased travel with his work have made it difficult to eat the way he wishes to and  needs to. Exercises daily 1.5-2 hours each am.   Diabetes Self-Management Education - 10/12/23 1224       Visit Information   Visit Type First/Initial      Initial Visit   Diabetes Type Type 1    Date Diagnosed 2013    Are you currently following a meal plan? No    Are you taking your medications as prescribed? No      Health Coping   How would you rate your overall health? Good      Psychosocial Assessment   Patient Belief/Attitude about Diabetes Denial    Self-care barriers None    Self-management support Doctor's office    Other persons present Patient    Patient Concerns Nutrition/Meal planning    Special Needs None    Preferred Learning Style No preference indicated    Learning Readiness Ready    How often do you need to have someone help you when you read instructions, pamphlets, or other written materials from your doctor or pharmacy? 1 - Never    What is the last grade level you completed in school? MBA      Pre-Education Assessment   Patient understands the diabetes disease and treatment process. Demonstrates understanding / competency    Patient understands incorporating nutritional management into lifestyle. Needs Review    Patient undertands incorporating physical activity into lifestyle. Demonstrates understanding / competency  Patient understands using medications safely. Needs Review    Patient understands monitoring blood glucose, interpreting and using results Needs Review    Patient understands prevention, detection, and treatment of acute complications. Needs Review    Patient understands prevention, detection, and treatment of chronic complications. Needs Review    Patient understands how to develop strategies to address psychosocial issues. Needs Review    Patient understands how to develop strategies to promote health/change behavior. Needs Review      Complications   Last HgB A1C per patient/outside source 10.1 %   05/27/2023   How often do you check  your blood sugar? > 4 times/day    Fasting Blood glucose range (mg/dL) 161-096;045-409    Postprandial Blood glucose range (mg/dL) >811    Number of hypoglycemic episodes per month 1    Can you tell when your blood sugar is low? Yes    What do you do if your blood sugar is low? drinks regular soda or sweet tarts    Have you had a dilated eye exam in the past 12 months? No    Have you had a dental exam in the past 12 months? Yes    Are you checking your feet? Yes    How many days per week are you checking your feet? 2      Dietary Intake   Breakfast 2 premier protein shakes    Snack (morning) banana    Lunch broiled fish, potatoes, rice    Snack (afternoon) none    Dinner tuna salad on 2 slices of sourdough rye, 3 pieces of salami    Snack (evening) none    Beverage(s) black coffe, water, seltzer water, diet juice      Activity / Exercise   Activity / Exercise Type Moderate (swimming / aerobic walking)    How many days per week do you exercise? 7    How many minutes per day do you exercise? 105    Total minutes per week of exercise 735      Patient Education   Previous Diabetes Education Yes (please comment)   2017   Healthy Eating Carbohydrate counting;Food label reading, portion sizes and measuring food.;Meal options for control of blood glucose level and chronic complications.    Being Active Role of exercise on diabetes management, blood pressure control and cardiac health.    Medications Reviewed patients medication for diabetes, action, purpose, timing of dose and side effects.    Monitoring Taught/evaluated CGM (comment)    Acute complications Taught prevention, symptoms, and  treatment of hypoglycemia - the 15 rule.    Chronic complications Identified and discussed with patient  current chronic complications    Diabetes Stress and Support Identified and addressed patients feelings and concerns about diabetes;Worked with patient to identify barriers to care and solutions       Individualized Goals (developed by patient)   Nutrition General guidelines for healthy choices and portions discussed    Physical Activity Exercise 5-7 days per week;60 minutes per day    Medications take my medication as prescribed    Monitoring  Consistenly use CGM    Problem Solving Addressing barriers to behavior change    Reducing Risk examine blood glucose patterns;do foot checks daily;treat hypoglycemia with 15 grams of carbs if blood glucose less than 70mg /dL;check ketones if blood glucose over 240mg /dL      Post-Education Assessment   Patient understands the diabetes disease and treatment process. Demonstrates understanding / competency  Patient understands incorporating nutritional management into lifestyle. Comprehends key points    Patient undertands incorporating physical activity into lifestyle. Demonstrates understanding / competency    Patient understands using medications safely. Demonstrates understanding / competency    Patient understands monitoring blood glucose, interpreting and using results Demonstrates understanding / competency    Patient understands prevention, detection, and treatment of acute complications. Demonstrates understanding / competency    Patient understands prevention, detection, and treatment of chronic complications. Demonstrates understanding / competency    Patient understands how to develop strategies to address psychosocial issues. Demonstrates understanding / competency    Patient understands how to develop strategies to promote health/change behavior. Needs Review      Outcomes   Expected Outcomes Demonstrated interest in learning. Expect positive outcomes    Future DMSE 2 months    Program Status Not Completed             Individualized Plan for Diabetes Self-Management Training:   Learning Objective:  Patient will have a greater understanding of diabetes self-management. Patient education plan is to attend individual and/or  group sessions per assessed needs and concerns.   Plan:   Patient Instructions  Remember to count carbohydrates and bolus for a meal before you eat (preferably 15 minutes). Consider using the calorie king app.  Consider contacting [employer] support as your Dexcom may not be porting into your pump even in auto mode OR contact Dexcom and ask hot to get your Dexcom report to update in the [facility] app.    Expected Outcomes:  Demonstrated interest in learning. Expect positive outcomes  Education material provided: Food label handouts and Meal plan card, carbohydrate counting  If problems or questions, patient to contact team via:  Phone  Future DSME appointment: 2 months

## 2023-10-12 NOTE — Patient Instructions (Addendum)
 Remember to count carbohydrates and bolus for a meal before you eat (preferably 15 minutes). Consider using the calorie king app.  Consider contacting [employer] support as your Dexcom may not be porting into your pump even in auto mode OR contact Dexcom and ask hot to get your Dexcom report to update in the [facility] app.

## 2023-11-25 DIAGNOSIS — M25531 Pain in right wrist: Secondary | ICD-10-CM | POA: Diagnosis not present

## 2023-11-25 DIAGNOSIS — M1811 Unilateral primary osteoarthritis of first carpometacarpal joint, right hand: Secondary | ICD-10-CM | POA: Diagnosis not present

## 2023-11-26 DIAGNOSIS — Z794 Long term (current) use of insulin: Secondary | ICD-10-CM | POA: Diagnosis not present

## 2023-11-26 DIAGNOSIS — E1065 Type 1 diabetes mellitus with hyperglycemia: Secondary | ICD-10-CM | POA: Diagnosis not present

## 2023-11-26 DIAGNOSIS — E785 Hyperlipidemia, unspecified: Secondary | ICD-10-CM | POA: Diagnosis not present

## 2023-12-03 ENCOUNTER — Ambulatory Visit: Admitting: Dietician

## 2024-01-11 DIAGNOSIS — M19042 Primary osteoarthritis, left hand: Secondary | ICD-10-CM | POA: Diagnosis not present

## 2024-03-02 DIAGNOSIS — Z794 Long term (current) use of insulin: Secondary | ICD-10-CM | POA: Diagnosis not present

## 2024-03-02 DIAGNOSIS — E1065 Type 1 diabetes mellitus with hyperglycemia: Secondary | ICD-10-CM | POA: Diagnosis not present

## 2024-03-02 DIAGNOSIS — E785 Hyperlipidemia, unspecified: Secondary | ICD-10-CM | POA: Diagnosis not present

## 2024-07-10 ENCOUNTER — Other Ambulatory Visit: Payer: Self-pay | Admitting: Cardiovascular Disease

## 2024-07-10 DIAGNOSIS — I25118 Atherosclerotic heart disease of native coronary artery with other forms of angina pectoris: Secondary | ICD-10-CM

## 2024-07-12 MED ORDER — CLOPIDOGREL BISULFATE 75 MG PO TABS: 75.0000 mg | ORAL_TABLET | Freq: Every day | ORAL | 0 refills | Status: DC

## 2024-07-27 ENCOUNTER — Other Ambulatory Visit: Payer: Self-pay | Admitting: Cardiovascular Disease

## 2024-07-27 DIAGNOSIS — I25118 Atherosclerotic heart disease of native coronary artery with other forms of angina pectoris: Secondary | ICD-10-CM

## 2024-08-13 ENCOUNTER — Other Ambulatory Visit: Payer: Self-pay | Admitting: Cardiovascular Disease

## 2024-08-13 DIAGNOSIS — I25118 Atherosclerotic heart disease of native coronary artery with other forms of angina pectoris: Secondary | ICD-10-CM

## 2024-08-19 NOTE — Telephone Encounter (Signed)
 In accordance with refill protocols, please review and address the following requirements before this medication refill can be authorized:  Labs
